# Patient Record
Sex: Male | Born: 1935 | Race: White | Hispanic: No | Marital: Married | Smoking: Former smoker
Health system: Southern US, Community
[De-identification: ages and names within clinical notes are randomized; demographics above are authoritative.]

## PROBLEM LIST (undated history)

## (undated) DIAGNOSIS — J189 Pneumonia, unspecified organism: Secondary | ICD-10-CM

## (undated) DIAGNOSIS — K219 Gastro-esophageal reflux disease without esophagitis: Secondary | ICD-10-CM

## (undated) DIAGNOSIS — R0602 Shortness of breath: Secondary | ICD-10-CM

## (undated) DIAGNOSIS — R918 Other nonspecific abnormal finding of lung field: Secondary | ICD-10-CM

## (undated) DIAGNOSIS — C801 Malignant (primary) neoplasm, unspecified: Secondary | ICD-10-CM

## (undated) DIAGNOSIS — Z8719 Personal history of other diseases of the digestive system: Secondary | ICD-10-CM

## (undated) DIAGNOSIS — I10 Essential (primary) hypertension: Secondary | ICD-10-CM

## (undated) DIAGNOSIS — Z9889 Other specified postprocedural states: Secondary | ICD-10-CM

## (undated) DIAGNOSIS — K769 Liver disease, unspecified: Secondary | ICD-10-CM

## (undated) DIAGNOSIS — H9190 Unspecified hearing loss, unspecified ear: Secondary | ICD-10-CM

## (undated) DIAGNOSIS — T4145XA Adverse effect of unspecified anesthetic, initial encounter: Secondary | ICD-10-CM

## (undated) DIAGNOSIS — Z87442 Personal history of urinary calculi: Secondary | ICD-10-CM

## (undated) DIAGNOSIS — T8859XA Other complications of anesthesia, initial encounter: Secondary | ICD-10-CM

## (undated) DIAGNOSIS — M1990 Unspecified osteoarthritis, unspecified site: Secondary | ICD-10-CM

## (undated) DIAGNOSIS — R590 Localized enlarged lymph nodes: Secondary | ICD-10-CM

## (undated) HISTORY — PX: EYE SURGERY: SHX253

## (undated) HISTORY — DX: Localized enlarged lymph nodes: R59.0

## (undated) HISTORY — DX: Other nonspecific abnormal finding of lung field: R91.8

## (undated) HISTORY — PX: ERCP: SHX60

## (undated) HISTORY — PX: CHOLECYSTECTOMY: SHX55

## (undated) HISTORY — DX: Liver disease, unspecified: K76.9

---

## 1992-01-12 HISTORY — PX: OTHER SURGICAL HISTORY: SHX169

## 1999-01-12 DIAGNOSIS — Z8719 Personal history of other diseases of the digestive system: Secondary | ICD-10-CM

## 1999-01-12 HISTORY — DX: Personal history of other diseases of the digestive system: Z87.19

## 2010-10-19 ENCOUNTER — Encounter (INDEPENDENT_AMBULATORY_CARE_PROVIDER_SITE_OTHER): Payer: Self-pay | Admitting: Ophthalmology

## 2010-10-27 ENCOUNTER — Encounter (INDEPENDENT_AMBULATORY_CARE_PROVIDER_SITE_OTHER): Payer: Medicare Other | Admitting: Ophthalmology

## 2010-10-27 DIAGNOSIS — H3530 Unspecified macular degeneration: Secondary | ICD-10-CM

## 2010-10-27 DIAGNOSIS — H43819 Vitreous degeneration, unspecified eye: Secondary | ICD-10-CM

## 2010-10-27 DIAGNOSIS — H26499 Other secondary cataract, unspecified eye: Secondary | ICD-10-CM

## 2010-11-03 ENCOUNTER — Encounter (INDEPENDENT_AMBULATORY_CARE_PROVIDER_SITE_OTHER): Payer: Medicare Other | Admitting: Ophthalmology

## 2010-11-03 DIAGNOSIS — H2700 Aphakia, unspecified eye: Secondary | ICD-10-CM

## 2011-03-05 ENCOUNTER — Encounter (INDEPENDENT_AMBULATORY_CARE_PROVIDER_SITE_OTHER): Payer: Self-pay | Admitting: *Deleted

## 2011-03-10 ENCOUNTER — Encounter (INDEPENDENT_AMBULATORY_CARE_PROVIDER_SITE_OTHER): Payer: Self-pay | Admitting: *Deleted

## 2011-03-10 ENCOUNTER — Other Ambulatory Visit (INDEPENDENT_AMBULATORY_CARE_PROVIDER_SITE_OTHER): Payer: Self-pay | Admitting: *Deleted

## 2011-03-10 ENCOUNTER — Telehealth (INDEPENDENT_AMBULATORY_CARE_PROVIDER_SITE_OTHER): Payer: Self-pay | Admitting: *Deleted

## 2011-03-10 DIAGNOSIS — Z1211 Encounter for screening for malignant neoplasm of colon: Secondary | ICD-10-CM

## 2011-03-10 NOTE — Telephone Encounter (Signed)
Requesting MD/PCP:  Burdine     Name & DOB: Tyler Pitts December 25, 2024     Procedure: TCS  Reason/Indication:  screening  Has patient had this procedure before?  no  If so, when, by whom and where?    Is there a family history of colon cancer?  no  Who?  What age when diagnosed?    Is patient diabetic?   no      Does patient have prosthetic heart valve?  no  Do you have a pacemaker?  no  Has patient had joint replacement within last 12 months?  no  Is patient on Coumadin, Plavix and/or Aspirin? no  Medications: triamterene/hctz 37.5/25 mg daily, flomax 0.4 mg daily  Allergies: finsin ?  Pharmacy: [facility] sample  Medication Adjustment:   Procedure date & time: 04/02/11 at 1030

## 2011-03-10 NOTE — Telephone Encounter (Signed)
agree

## 2011-03-29 ENCOUNTER — Encounter (HOSPITAL_COMMUNITY): Payer: Self-pay | Admitting: Pharmacy Technician

## 2011-04-01 MED ORDER — SODIUM CHLORIDE 0.45 % IV SOLN
Freq: Once | INTRAVENOUS | Status: AC
  Administered 2011-04-02: 10:00:00 via INTRAVENOUS

## 2011-04-02 ENCOUNTER — Encounter (HOSPITAL_COMMUNITY)
Admission: RE | Disposition: A | Discharge status: Home / Self Care | Payer: Self-pay | Source: Ambulatory Visit | Attending: Internal Medicine

## 2011-04-02 ENCOUNTER — Ambulatory Visit (HOSPITAL_COMMUNITY)
Admission: RE | Admit: 2011-04-02 | Discharge: 2011-04-02 | Disposition: A | Discharge status: Home / Self Care | Payer: Medicare Other | Source: Ambulatory Visit | Attending: Internal Medicine | Admitting: Internal Medicine

## 2011-04-02 ENCOUNTER — Encounter (HOSPITAL_COMMUNITY): Payer: Self-pay | Admitting: *Deleted

## 2011-04-02 DIAGNOSIS — Z1211 Encounter for screening for malignant neoplasm of colon: Secondary | ICD-10-CM

## 2011-04-02 DIAGNOSIS — D126 Benign neoplasm of colon, unspecified: Secondary | ICD-10-CM | POA: Insufficient documentation

## 2011-04-02 DIAGNOSIS — Z79899 Other long term (current) drug therapy: Secondary | ICD-10-CM | POA: Insufficient documentation

## 2011-04-02 DIAGNOSIS — I10 Essential (primary) hypertension: Secondary | ICD-10-CM | POA: Insufficient documentation

## 2011-04-02 HISTORY — PX: COLONOSCOPY: SHX5424

## 2011-04-02 HISTORY — DX: Essential (primary) hypertension: I10

## 2011-04-02 HISTORY — DX: Other specified postprocedural states: Z98.890

## 2011-04-02 HISTORY — DX: Shortness of breath: R06.02

## 2011-04-02 HISTORY — DX: Personal history of other diseases of the digestive system: Z87.19

## 2011-04-02 SURGERY — COLONOSCOPY
Anesthesia: Moderate Sedation

## 2011-04-02 MED ORDER — MEPERIDINE HCL 50 MG/ML IJ SOLN
INTRAMUSCULAR | Status: DC | PRN
  Administered 2011-04-02: 25 mg

## 2011-04-02 MED ORDER — MIDAZOLAM HCL 5 MG/5ML IJ SOLN
INTRAMUSCULAR | Status: AC
  Filled 2011-04-02: qty 10

## 2011-04-02 MED ORDER — STERILE WATER FOR IRRIGATION IR SOLN
Status: DC | PRN
  Administered 2011-04-02: 11:00:00

## 2011-04-02 MED ORDER — MEPERIDINE HCL 50 MG/ML IJ SOLN
INTRAMUSCULAR | Status: AC
  Filled 2011-04-02: qty 1

## 2011-04-02 MED ORDER — MIDAZOLAM HCL 5 MG/5ML IJ SOLN
INTRAMUSCULAR | Status: DC | PRN
  Administered 2011-04-02: 2 mg via INTRAVENOUS

## 2011-04-02 NOTE — Op Note (Addendum)
COLONOSCOPY PROCEDURE REPORT  PATIENT:  Tyler Pitts  MR#:  [id number] Birthdate:  [birth year], 78 y.o., male Endoscopist:  Dr. Malissa Hippo, MD Referred By:  Dr. Juliette Alcide, MD Procedure Date: 04/02/2011  Procedure:   Colonoscopy with snare polypectomy.  Indications: Patient is a 57-year-old Caucasian male who is undergoing average risk screening colonoscopy. This is patient's first exam.  Informed Consent:  The procedure and risks were reviewed with the patient and informed consent was obtained.  Medications:  Demerol 25 mg IV Versed 2 mg IV  Description of procedure:  After a digital rectal exam was performed, that colonoscope was advanced from the anus through the rectum and colon to the area of the cecum, ileocecal valve and appendiceal orifice. The cecum was deeply intubated. These structures were well-seen and photographed for the record. From the level of the cecum and ileocecal valve, the scope was slowly and cautiously withdrawn. The mucosal surfaces were carefully surveyed utilizing scope tip to flexion to facilitate fold flattening as needed. The scope was pulled down into the rectum where a thorough exam including retroflexion was performed.  Findings:   Prep fair. 10 mm sessile polyp snared from ascending colon following saline injection. Some oozing at polypectomy site controlled with single Hemoclip application. 2 small polyps snared from hepatic flexure and submitted together. 8 mm broad-based polyp snared splenic flexure. 3 small polyp snare from sigmoid colon and submitted together. Normal rectal mucosa and anal rectal junction.   Therapeutic/Diagnostic Maneuvers Performed:  See above  Complications:  None  Cecal Withdrawal Time:  37 minutes  Impression:  Examination performed to cecum. 10 mm polyp hot snared from ascending colon following saline injection. Hemoclip applied to polypectomy site because of oozing. Two small polyp snared from hepatic  flexure and submitted together. 8 mm polyp snared from splenic flexure. Three small polyp snare from sigmoid colon and submitted together  Recommendations:  Standard instructions given. No aspirin for 10 days. Patient also instructed not to have MRI until documented to have passed the Hemoclip. I will contact patient with biopsy results.  Shikara Mcauliffe U  04/02/2011 11:33 AM  CC: Dr. Juliette Alcide, MD, MD & Dr. Bonnetta Barry ref. provider found

## 2011-04-02 NOTE — Discharge Instructions (Signed)
No aspirin for 10 days. His usual medications and diet. No driving for 24 hours. Physician will contact you with biopsy results  Colonoscopy Care After These instructions give you information on caring for yourself after your procedure. Your doctor may also give you more specific instructions. Call your doctor if you have any problems or questions after your procedure. HOME CARE  Take it easy for the next 24 hours.   Rest.   Walk or use warm packs on your belly (abdomen) if you have belly cramping or gas.   Do not drive for 24 hours.   You may shower.   Do not sign important papers or use machinery for 24 hours.   Drink enough fluids to keep your pee (urine) clear or pale yellow.   Resume your normal diet. Avoid heavy or fried foods.   Avoid alcohol.   Continue taking your normal medicines.   Only take medicine as told by your doctor. Do not take aspirin.  If you had growths (polyps) removed:  Do not take aspirin.   Do not drink alcohol for 7 days or as told by your doctor.   Eat a soft diet for 24 hours.  GET HELP RIGHT AWAY IF:  You have a fever.   You pass clumps of tissue (blood clots) or fill the toilet with blood.   You have belly pain that gets worse and medicine does not help.   Your belly is puffy (swollen).   You feel sick to your stomach (nauseous) or throw up (vomit).  MAKE SURE YOU:  Understand these instructions.   Will watch your condition.   Will get help right away if you are not doing well or get worse.  Document Released: 01/30/2010 Document Revised: 12/17/2010 Document Reviewed: 01/30/2010 [facility] Patient Information 2012 [place], [place].  Colon Polyps A polyp is extra tissue that grows inside your body. Colon polyps grow in the large intestine. The large intestine, also called the colon, is part of your digestive system. It is a long, hollow tube at the end of your digestive tract where your body makes and stores stool. Most polyps  are not dangerous. They are benign. This means they are not cancerous. But over time, some types of polyps can turn into cancer. Polyps that are smaller than a pea are usually not harmful. But larger polyps could someday become or may already be cancerous. To be safe, doctors remove all polyps and test them.  WHO GETS POLYPS? Anyone can get polyps, but certain people are more likely than others. You may have a greater chance of getting polyps if:  You are over 50.   You have had polyps before.   Someone in your family has had polyps.   Someone in your family has had cancer of the large intestine.   Find out if someone in your family has had polyps. You may also be more likely to get polyps if you:   Eat a lot of fatty foods.   Smoke.   Drink alcohol.   Do not exercise.   Eat too much.  SYMPTOMS  Most small polyps do not cause symptoms. People often do not know they have one until their caregiver finds it during a regular checkup or while testing them for something else. Some people do have symptoms like these:  Bleeding from the anus. You might notice blood on your underwear or on toilet paper after you have had a bowel movement.   Constipation or diarrhea that lasts  more than a week.   Blood in the stool. Blood can make stool look black or it can show up as red streaks in the stool.  If you have any of these symptoms, see your caregiver. HOW DOES THE DOCTOR TEST FOR POLYPS? The doctor can use four tests to check for polyps:  Digital rectal exam. The caregiver wears gloves and checks your rectum (the last part of the large intestine) to see if it feels normal. This test would find polyps only in the rectum. Your caregiver may need to do one of the other tests listed below to find polyps higher up in the intestine.   Barium enema. The caregiver puts a liquid called barium into your rectum before taking x-rays of your large intestine. Barium makes your intestine look white in the  pictures. Polyps are dark, so they are easy to see.   Sigmoidoscopy. With this test, the caregiver can see inside your large intestine. A thin flexible tube is placed into your rectum. The device is called a sigmoidoscope, which has a light and a tiny video camera in it. The caregiver uses the sigmoidoscope to look at the last third of your large intestine.   Colonoscopy. This test is like sigmoidoscopy, but the caregiver looks at all of the large intestine. It usually requires sedation. This is the most common method for finding and removing polyps.  TREATMENT   The caregiver will remove the polyp during sigmoidoscopy or colonoscopy. The polyp is then tested for cancer.   If you have had polyps, your caregiver may want you to get tested regularly in the future.  PREVENTION  There is not one sure way to prevent polyps. You might be able to lower your risk of getting them if you:  Eat more fruits and vegetables and less fatty food.   Do not smoke.   Avoid alcohol.   Exercise every day.   Lose weight if you are overweight.   Eating more calcium and folate can also lower your risk of getting polyps. Some foods that are rich in calcium are milk, cheese, and broccoli. Some foods that are rich in folate are chickpeas, kidney beans, and spinach.   Aspirin might help prevent polyps. Studies are under way.  Document Released: 09/24/2003 Document Revised: 12/17/2010 Document Reviewed: 03/01/2007 [facility] Patient Information 2012 [place], [place].

## 2011-04-02 NOTE — H&P (Signed)
Tyler Pitts is an 78 y.o. male.   Chief Complaint: Patient is here for colonoscopy. HPI: Patient is an 78 year old Caucasian male who is here for his first average risk screening colonoscopy. He denies abdominal pain change in his bowel habits or rectal bleeding. Since he is doing so well from a standpoint he after discussion with his primary care physician Dr. Lynelle Smoke decided to have this examination.  Past Medical History  Diagnosis Date  . Hypertension   . History of pancreatitis 2001  . History of ERCP   . Shortness of breath     Past Surgical History  Procedure Date  . Left hip surgery 1995  . Cholecystectomy   . Ercp     Family History  Problem Relation Age of Onset  . Lung cancer Mother   . Lung cancer Father   . Colon cancer Neg Hx    Social History:  reports that he has quit smoking. He does not have any smokeless tobacco history on file. He reports that he does not drink alcohol or use illicit drugs.  Allergies: No Known Allergies  Medications Prior to Admission  Medication Dose Route Frequency Provider Last Rate Last Dose  . 0.45 % sodium chloride infusion   Intravenous Once Malissa Hippo, MD 20 mL/hr at 04/02/11 0940    . meperidine (DEMEROL) 50 MG/ML injection           . midazolam (VERSED) 5 MG/5ML injection            Medications Prior to Admission  Medication Sig Dispense Refill  . Tamsulosin HCl (FLOMAX) 0.4 MG CAPS Take 0.4 mg by mouth daily.      Marland Kitchen triamterene-hydrochlorothiazide (MAXZIDE-25) 37.5-25 MG per tablet Take 1 tablet by mouth daily.        No results found for this or any previous visit (from the past 48 hour(s)). No results found.  Review of Systems  Constitutional: Negative for weight loss.  Gastrointestinal: Negative for abdominal pain, diarrhea, constipation, blood in stool and melena.    Blood pressure 152/81, temperature 98.1 F (36.7 C), temperature source Oral, resp. rate 20, height 5\' 6"  (1.676 m), weight 165 lb  (74.844 kg), SpO2 95.00%. Physical Exam  Constitutional: He appears well-developed and well-nourished.  HENT:  Mouth/Throat: Oropharynx is clear and moist.  Eyes: Conjunctivae are normal. No scleral icterus.  Neck: No thyromegaly present.  Cardiovascular: Normal rate, regular rhythm and normal heart sounds.   No murmur heard. Respiratory: Effort normal and breath sounds normal.  GI: Soft. He exhibits no mass. There is no tenderness.  Musculoskeletal: He exhibits no edema.  Lymphadenopathy:    He has no cervical adenopathy.  Neurological: He is alert.  Skin: Skin is warm and dry.     Assessment/Plan Average risk screening colonoscopy.  Nancyann Cotterman U 04/02/2011, 10:38 AM

## 2011-04-06 ENCOUNTER — Encounter (HOSPITAL_COMMUNITY): Payer: Self-pay | Admitting: Internal Medicine

## 2011-04-14 ENCOUNTER — Encounter (INDEPENDENT_AMBULATORY_CARE_PROVIDER_SITE_OTHER): Payer: Self-pay | Admitting: *Deleted

## 2014-03-08 ENCOUNTER — Encounter (INDEPENDENT_AMBULATORY_CARE_PROVIDER_SITE_OTHER): Payer: Self-pay | Admitting: *Deleted

## 2014-03-14 ENCOUNTER — Other Ambulatory Visit (INDEPENDENT_AMBULATORY_CARE_PROVIDER_SITE_OTHER): Payer: Self-pay | Admitting: *Deleted

## 2014-03-14 ENCOUNTER — Telehealth (INDEPENDENT_AMBULATORY_CARE_PROVIDER_SITE_OTHER): Payer: Self-pay | Admitting: *Deleted

## 2014-03-14 DIAGNOSIS — Z86010 Personal history of colonic polyps: Secondary | ICD-10-CM

## 2014-03-14 DIAGNOSIS — Z1211 Encounter for screening for malignant neoplasm of colon: Secondary | ICD-10-CM

## 2014-03-14 NOTE — Telephone Encounter (Signed)
Patient needs trilyte 

## 2014-03-15 MED ORDER — PEG 3350-KCL-NA BICARB-NACL 420 G PO SOLR: 4000.0000 mL | Freq: Once | ORAL | Status: DC

## 2014-04-08 ENCOUNTER — Telehealth (INDEPENDENT_AMBULATORY_CARE_PROVIDER_SITE_OTHER): Payer: Self-pay | Admitting: *Deleted

## 2014-04-08 NOTE — Telephone Encounter (Signed)
Referring MD/PCP: burdine   Procedure: tcs  Reason/Indication:  Hx polyps  Has patient had this procedure before?  Yes, 2013 -- epic  If so, when, by whom and where?    Is there a family history of colon cancer?  no  Who?  What age when diagnosed?    Is patient diabetic?   no      Does patient have prosthetic heart valve?  no  Do you have a pacemaker?  no  Has patient ever had endocarditis? no  Has patient had joint replacement within last 12 months?  no  Does patient tend to be constipated or take laxatives? ocassionally  Is patient on Coumadin, Plavix and/or Aspirin? no  Medications: see epic  Allergies: see epic  Medication Adjustment:   Procedure date & time: 05/09/14 at 1030

## 2014-04-08 NOTE — Telephone Encounter (Signed)
agree

## 2014-05-09 ENCOUNTER — Ambulatory Visit (HOSPITAL_COMMUNITY)
Admission: RE | Admit: 2014-05-09 | Discharge: 2014-05-09 | Disposition: A | Discharge status: Home / Self Care | Payer: Medicare Other | Source: Ambulatory Visit | Attending: Internal Medicine | Admitting: Internal Medicine

## 2014-05-09 ENCOUNTER — Encounter (HOSPITAL_COMMUNITY): Payer: Self-pay

## 2014-05-09 ENCOUNTER — Encounter (HOSPITAL_COMMUNITY)
Admission: RE | Disposition: A | Discharge status: Home / Self Care | Payer: Self-pay | Source: Ambulatory Visit | Attending: Internal Medicine

## 2014-05-09 DIAGNOSIS — Z9049 Acquired absence of other specified parts of digestive tract: Secondary | ICD-10-CM | POA: Diagnosis not present

## 2014-05-09 DIAGNOSIS — Z79899 Other long term (current) drug therapy: Secondary | ICD-10-CM | POA: Insufficient documentation

## 2014-05-09 DIAGNOSIS — K648 Other hemorrhoids: Secondary | ICD-10-CM | POA: Diagnosis not present

## 2014-05-09 DIAGNOSIS — Z87891 Personal history of nicotine dependence: Secondary | ICD-10-CM | POA: Insufficient documentation

## 2014-05-09 DIAGNOSIS — Z86010 Personal history of colonic polyps: Secondary | ICD-10-CM

## 2014-05-09 DIAGNOSIS — D123 Benign neoplasm of transverse colon: Secondary | ICD-10-CM | POA: Diagnosis not present

## 2014-05-09 DIAGNOSIS — Z09 Encounter for follow-up examination after completed treatment for conditions other than malignant neoplasm: Secondary | ICD-10-CM | POA: Diagnosis present

## 2014-05-09 DIAGNOSIS — D125 Benign neoplasm of sigmoid colon: Secondary | ICD-10-CM | POA: Diagnosis not present

## 2014-05-09 DIAGNOSIS — K5730 Diverticulosis of large intestine without perforation or abscess without bleeding: Secondary | ICD-10-CM

## 2014-05-09 DIAGNOSIS — K644 Residual hemorrhoidal skin tags: Secondary | ICD-10-CM | POA: Insufficient documentation

## 2014-05-09 DIAGNOSIS — I10 Essential (primary) hypertension: Secondary | ICD-10-CM | POA: Insufficient documentation

## 2014-05-09 DIAGNOSIS — D120 Benign neoplasm of cecum: Secondary | ICD-10-CM | POA: Insufficient documentation

## 2014-05-09 HISTORY — PX: COLONOSCOPY: SHX5424

## 2014-05-09 SURGERY — COLONOSCOPY
Anesthesia: Moderate Sedation

## 2014-05-09 MED ORDER — MEPERIDINE HCL 50 MG/ML IJ SOLN
INTRAMUSCULAR | Status: AC
  Filled 2014-05-09: qty 1

## 2014-05-09 MED ORDER — MIDAZOLAM HCL 5 MG/5ML IJ SOLN
INTRAMUSCULAR | Status: AC
  Filled 2014-05-09: qty 10

## 2014-05-09 MED ORDER — SODIUM CHLORIDE 0.9 % IV SOLN
INTRAVENOUS | Status: DC
  Administered 2014-05-09: 11:00:00 via INTRAVENOUS

## 2014-05-09 MED ORDER — STERILE WATER FOR IRRIGATION IR SOLN
Status: DC | PRN
  Administered 2014-05-09: 11:00:00

## 2014-05-09 MED ORDER — MIDAZOLAM HCL 5 MG/5ML IJ SOLN
INTRAMUSCULAR | Status: DC | PRN
  Administered 2014-05-09: 2 mg via INTRAVENOUS
  Administered 2014-05-09: 1 mg via INTRAVENOUS

## 2014-05-09 MED ORDER — MEPERIDINE HCL 50 MG/ML IJ SOLN
INTRAMUSCULAR | Status: DC | PRN
  Administered 2014-05-09: 25 mg via INTRAVENOUS

## 2014-05-09 NOTE — Op Note (Signed)
COLONOSCOPY PROCEDURE REPORT  PATIENT:  ANKIT DEGREGORIO  MR#:  [id number] Birthdate:  1924-12-06, 81 y.o., male Endoscopist:  Dr. Rogene Houston, MD Referred By:  Dr. Curlene Labrum, MD Procedure Date: 05/09/2014  Procedure:   Colonoscopy with snare polypectomy  Indications:  Patient is 81 year old Caucasian male who had his first screening colonoscopy in March 2013 with removal of 7 polyps in 4 were tubular adenomas. He has no GI symptoms. Is returning for surveillance colonoscopy.  Informed Consent:  The procedure and risks were reviewed with the patient and informed consent was obtained.  Medications:  Demerol 25 mg IV Versed 3 mg IV  Description of procedure:  After a digital rectal exam was performed, that colonoscope was advanced from the anus through the rectum and colon to the area of the cecum, ileocecal valve and appendiceal orifice. The cecum was deeply intubated. These structures were well-seen and photographed for the record. From the level of the cecum and ileocecal valve, the scope was slowly and cautiously withdrawn. The mucosal surfaces were carefully surveyed utilizing scope tip to flexion to facilitate fold flattening as needed. The scope was pulled down into the rectum where a thorough exam including retroflexion was performed.  Findings:   Prep excellent. 4 small polyps were cold snared and submitted together. One may have been lost. Two were at cecum and 2 at sigmoid colon. 6 mm polyp hot snared from hepatic flexure and submitted separately. Single small diverticulum at splenic flexure. Hemorrhoids above and below the dentate line.   Therapeutic/Diagnostic Maneuvers Performed:  See above  Complications:  None  Cecal Withdrawal Time:  17 minutes  Impression:  Examination performed to cecum. Four small polyps were cold snared and submitted together;  two of these polyps were at cecum and two at sigmoid colon. 6 mm polyp hot snare from hepatic  flexure. Single small diverticulum at splenic flexure. Small internal and external hemorrhoids.  Recommendations:  Standard instructions given. No aspirin or NSAIDs for 1 week. I will contact patient with biopsy results and further recommendations.  REHMAN,NAJEEB U  05/09/2014 12:08 PM  CC: Dr. Curlene Labrum, MD & Dr. Rayne Du ref. provider found

## 2014-05-09 NOTE — Discharge Instructions (Signed)
No aspirin or NSAIDs for 1 week. Resume usual medications and diet. No driving for 24 hours. Physician will call with biopsy results.     Colonoscopy, Care After These instructions give you information on caring for yourself after your procedure. Your doctor may also give you more specific instructions. Call your doctor if you have any problems or questions after your procedure. HOME CARE  Do not drive for 24 hours.  Do not sign important papers or use machinery for 24 hours.  You may shower.  You may go back to your usual activities, but go slower for the first 24 hours.  Take rest breaks often during the first 24 hours.  Walk around or use warm packs on your belly (abdomen) if you have belly cramping or gas.  Drink enough fluids to keep your pee (urine) clear or pale yellow.  Resume your normal diet. Avoid heavy or fried foods.  Avoid drinking alcohol for 24 hours or as told by your doctor.  Only take medicines as told by your doctor. If a tissue sample (biopsy) was taken during the procedure:   Do not take aspirin or blood thinners for 7 days, or as told by your doctor.  Do not drink alcohol for 7 days, or as told by your doctor.  Eat soft foods for the first 24 hours. GET HELP IF: You still have a small amount of blood in your poop (stool) 2-3 days after the procedure. GET HELP RIGHT AWAY IF:  You have more than a small amount of blood in your poop.  You see clumps of tissue (blood clots) in your poop.  Your belly is puffy (swollen).  You feel sick to your stomach (nauseous) or throw up (vomit).  You have a fever.  You have belly pain that gets worse and medicine does not help. MAKE SURE YOU:  Understand these instructions.  Will watch your condition.  Will get help right away if you are not doing well or get worse.   Colon Polyps Polyps are lumps of extra tissue growing inside the body. Polyps can grow in the large intestine (colon). Most colon  polyps are noncancerous (benign). However, some colon polyps can become cancerous over time. Polyps that are larger than a pea may be harmful. To be safe, caregivers remove and test all polyps. CAUSES  Polyps form when mutations in the genes cause your cells to grow and divide even though no more tissue is needed. RISK FACTORS There are a number of risk factors that can increase your chances of getting colon polyps. They include:  Being older than 50 years.  Family history of colon polyps or colon cancer.  Long-term colon diseases, such as colitis or Crohn disease.  Being overweight.  Smoking.  Being inactive.  Drinking too much alcohol. SYMPTOMS  Most small polyps do not cause symptoms. If symptoms are present, they may include:  Blood in the stool. The stool may look dark red or black.  Constipation or diarrhea that lasts longer than 1 week. DIAGNOSIS People often do not know they have polyps until their caregiver finds them during a regular checkup. Your caregiver can use 4 tests to check for polyps:  Digital rectal exam. The caregiver wears gloves and feels inside the rectum. This test would find polyps only in the rectum.  Barium enema. The caregiver puts a liquid called barium into your rectum before taking X-rays of your colon. Barium makes your colon look white. Polyps are dark, so they are  easy to see in the X-ray pictures.  Sigmoidoscopy. A thin, flexible tube (sigmoidoscope) is placed into your rectum. The sigmoidoscope has a light and tiny camera in it. The caregiver uses the sigmoidoscope to look at the last third of your colon.  Colonoscopy. This test is like sigmoidoscopy, but the caregiver looks at the entire colon. This is the most common method for finding and removing polyps. TREATMENT  Any polyps will be removed during a sigmoidoscopy or colonoscopy. The polyps are then tested for cancer. PREVENTION  To help lower your risk of getting more colon  polyps:  Eat plenty of fruits and vegetables. Avoid eating fatty foods.  Do not smoke.  Avoid drinking alcohol.  Exercise every day.  Lose weight if recommended by your caregiver.  Eat plenty of calcium and folate. Foods that are rich in calcium include milk, cheese, and broccoli. Foods that are rich in folate include chickpeas, kidney beans, and spinach. HOME CARE INSTRUCTIONS Keep all follow-up appointments as directed by your caregiver. You may need periodic exams to check for polyps. SEEK MEDICAL CARE IF: You notice bleeding during a bowel movement. Document Released: 09/24/2003 Document Revised: 03/22/2011 Document Reviewed: 03/09/2011 [facility] Patient Information 2015 [place], [place]. This information is not intended to replace advice given to you by your health care provider. Make sure you discuss any questions you have with your health care provider.

## 2014-05-09 NOTE — H&P (Signed)
Tyler Pitts is an 81 y.o. male.   Chief Complaint: Patient is here for colonoscopy. HPI: Patient is 81 year old Caucasian male who underwent revision screening colonoscopy in March 2015 and was found to have multiple polyps in 4 of these are tubular adenomas. He is therefore returning for surveillance colonoscopy. He denies abdominal pain change in bowel habits or rectal bleeding. He exercises regularly. He's cut back on starchy foods and has lost 15 pounds. Family history is negative for CRC. Both his parents died of lung carcinoma in her 71s and they both smoked cigarettes.  Past Medical History  Diagnosis Date  . Hypertension   . History of pancreatitis 2001  . History of ERCP   . Shortness of breath     Past Surgical History  Procedure Laterality Date  . Left hip surgery  1995  . Cholecystectomy    . Ercp    . Colonoscopy  04/02/2011    Procedure: COLONOSCOPY;  Surgeon: Tyler Houston, MD;  Location: [facility];  Service: Endoscopy;  Laterality: N/A;  1030    Family History  Problem Relation Age of Onset  . Lung cancer Mother   . Lung cancer Father   . Colon cancer Neg Hx    Social History:  reports that he has quit smoking. He does not have any smokeless tobacco history on file. He reports that he does not drink alcohol or use illicit drugs.  Allergies: No Known Allergies  Medications Prior to Admission  Medication Sig Dispense Refill  . polyethylene glycol-electrolytes (NULYTELY/GOLYTELY) 420 G solution Take 4,000 mLs by mouth once. 4000 mL 0  . Tamsulosin HCl (FLOMAX) 0.4 MG CAPS Take 0.4 mg by mouth daily.    Marland Kitchen triamterene-hydrochlorothiazide (MAXZIDE-25) 37.5-25 MG per tablet Take 1 tablet by mouth daily.      No results found for this or any previous visit (from the past 48 hour(s)). No results found.  ROS  Blood pressure 134/79, pulse 68, temperature 97.1 F (36.2 C), temperature source Oral, resp. rate 22, height '5\' 6"'$  (1.676 m), weight 150 lb (68.04  kg), SpO2 96 %. Physical Exam  Constitutional: He appears well-developed and well-nourished.  HENT:  Mouth/Throat: Oropharynx is clear and moist.  Eyes: Conjunctivae are normal. No scleral icterus.  Neck: No thyromegaly present.  Cardiovascular: Normal rate, regular rhythm and normal heart sounds.   No murmur heard. Respiratory: Effort normal and breath sounds normal.  GI: Soft. He exhibits no distension and no mass. There is no tenderness.  Musculoskeletal: He exhibits no edema.  Lymphadenopathy:    He has no cervical adenopathy.  Neurological: He is alert.  Skin: Skin is warm and dry.     Assessment/Plan History of colonic adenomas. Surveillance colonoscopy.  [name] U 05/09/2014, 11:20 AM

## 2014-05-10 ENCOUNTER — Encounter (HOSPITAL_COMMUNITY): Payer: Self-pay | Admitting: Internal Medicine

## 2014-05-15 ENCOUNTER — Encounter (INDEPENDENT_AMBULATORY_CARE_PROVIDER_SITE_OTHER): Payer: Self-pay | Admitting: *Deleted

## 2015-02-25 DIAGNOSIS — I10 Essential (primary) hypertension: Secondary | ICD-10-CM | POA: Diagnosis not present

## 2015-02-25 DIAGNOSIS — E782 Mixed hyperlipidemia: Secondary | ICD-10-CM | POA: Diagnosis not present

## 2015-03-07 DIAGNOSIS — I10 Essential (primary) hypertension: Secondary | ICD-10-CM | POA: Diagnosis not present

## 2015-03-07 DIAGNOSIS — E559 Vitamin D deficiency, unspecified: Secondary | ICD-10-CM | POA: Diagnosis not present

## 2015-03-07 DIAGNOSIS — Z86010 Personal history of colonic polyps: Secondary | ICD-10-CM | POA: Diagnosis not present

## 2015-03-07 DIAGNOSIS — E782 Mixed hyperlipidemia: Secondary | ICD-10-CM | POA: Diagnosis not present

## 2015-03-07 DIAGNOSIS — L719 Rosacea, unspecified: Secondary | ICD-10-CM | POA: Diagnosis not present

## 2015-09-16 DIAGNOSIS — M1990 Unspecified osteoarthritis, unspecified site: Secondary | ICD-10-CM | POA: Diagnosis not present

## 2015-09-16 DIAGNOSIS — E559 Vitamin D deficiency, unspecified: Secondary | ICD-10-CM | POA: Diagnosis not present

## 2015-09-16 DIAGNOSIS — R39198 Other difficulties with micturition: Secondary | ICD-10-CM | POA: Diagnosis not present

## 2015-09-16 DIAGNOSIS — I10 Essential (primary) hypertension: Secondary | ICD-10-CM | POA: Diagnosis not present

## 2015-09-16 DIAGNOSIS — E782 Mixed hyperlipidemia: Secondary | ICD-10-CM | POA: Diagnosis not present

## 2015-09-18 DIAGNOSIS — E782 Mixed hyperlipidemia: Secondary | ICD-10-CM | POA: Diagnosis not present

## 2015-09-18 DIAGNOSIS — M7552 Bursitis of left shoulder: Secondary | ICD-10-CM | POA: Diagnosis not present

## 2015-09-18 DIAGNOSIS — M19012 Primary osteoarthritis, left shoulder: Secondary | ICD-10-CM | POA: Diagnosis not present

## 2015-09-18 DIAGNOSIS — I10 Essential (primary) hypertension: Secondary | ICD-10-CM | POA: Diagnosis not present

## 2015-09-18 DIAGNOSIS — E559 Vitamin D deficiency, unspecified: Secondary | ICD-10-CM | POA: Diagnosis not present

## 2015-09-18 DIAGNOSIS — L719 Rosacea, unspecified: Secondary | ICD-10-CM | POA: Diagnosis not present

## 2015-11-17 DIAGNOSIS — I10 Essential (primary) hypertension: Secondary | ICD-10-CM | POA: Diagnosis not present

## 2015-11-17 DIAGNOSIS — H524 Presbyopia: Secondary | ICD-10-CM | POA: Diagnosis not present

## 2015-11-17 DIAGNOSIS — Z961 Presence of intraocular lens: Secondary | ICD-10-CM | POA: Diagnosis not present

## 2015-11-17 DIAGNOSIS — H35033 Hypertensive retinopathy, bilateral: Secondary | ICD-10-CM | POA: Diagnosis not present

## 2015-11-17 DIAGNOSIS — H353131 Nonexudative age-related macular degeneration, bilateral, early dry stage: Secondary | ICD-10-CM | POA: Diagnosis not present

## 2015-11-17 DIAGNOSIS — H5203 Hypermetropia, bilateral: Secondary | ICD-10-CM | POA: Diagnosis not present

## 2015-11-17 DIAGNOSIS — H43813 Vitreous degeneration, bilateral: Secondary | ICD-10-CM | POA: Diagnosis not present

## 2015-11-17 DIAGNOSIS — H5310 Unspecified subjective visual disturbances: Secondary | ICD-10-CM | POA: Diagnosis not present

## 2015-11-17 DIAGNOSIS — Z9849 Cataract extraction status, unspecified eye: Secondary | ICD-10-CM | POA: Diagnosis not present

## 2015-11-17 DIAGNOSIS — H5315 Visual distortions of shape and size: Secondary | ICD-10-CM | POA: Diagnosis not present

## 2015-12-22 DIAGNOSIS — R69 Illness, unspecified: Secondary | ICD-10-CM | POA: Diagnosis not present

## 2015-12-29 DIAGNOSIS — H43813 Vitreous degeneration, bilateral: Secondary | ICD-10-CM | POA: Diagnosis not present

## 2015-12-29 DIAGNOSIS — H353131 Nonexudative age-related macular degeneration, bilateral, early dry stage: Secondary | ICD-10-CM | POA: Diagnosis not present

## 2015-12-29 DIAGNOSIS — H35033 Hypertensive retinopathy, bilateral: Secondary | ICD-10-CM | POA: Diagnosis not present

## 2015-12-29 DIAGNOSIS — Z961 Presence of intraocular lens: Secondary | ICD-10-CM | POA: Diagnosis not present

## 2015-12-29 DIAGNOSIS — H5315 Visual distortions of shape and size: Secondary | ICD-10-CM | POA: Diagnosis not present

## 2015-12-29 DIAGNOSIS — Z9849 Cataract extraction status, unspecified eye: Secondary | ICD-10-CM | POA: Diagnosis not present

## 2016-02-02 DIAGNOSIS — Z23 Encounter for immunization: Secondary | ICD-10-CM | POA: Diagnosis not present

## 2016-03-06 DIAGNOSIS — J189 Pneumonia, unspecified organism: Secondary | ICD-10-CM | POA: Diagnosis not present

## 2016-03-06 DIAGNOSIS — Z79899 Other long term (current) drug therapy: Secondary | ICD-10-CM | POA: Diagnosis not present

## 2016-03-06 DIAGNOSIS — Z87891 Personal history of nicotine dependence: Secondary | ICD-10-CM | POA: Diagnosis not present

## 2016-03-06 DIAGNOSIS — I10 Essential (primary) hypertension: Secondary | ICD-10-CM | POA: Diagnosis not present

## 2016-03-06 DIAGNOSIS — M79602 Pain in left arm: Secondary | ICD-10-CM | POA: Diagnosis not present

## 2016-03-06 DIAGNOSIS — R0789 Other chest pain: Secondary | ICD-10-CM | POA: Diagnosis not present

## 2016-03-06 DIAGNOSIS — Z888 Allergy status to other drugs, medicaments and biological substances status: Secondary | ICD-10-CM | POA: Diagnosis not present

## 2016-03-06 DIAGNOSIS — R918 Other nonspecific abnormal finding of lung field: Secondary | ICD-10-CM | POA: Diagnosis not present

## 2016-03-06 DIAGNOSIS — R079 Chest pain, unspecified: Secondary | ICD-10-CM | POA: Diagnosis not present

## 2016-03-06 DIAGNOSIS — R1013 Epigastric pain: Secondary | ICD-10-CM | POA: Diagnosis not present

## 2016-03-06 DIAGNOSIS — N400 Enlarged prostate without lower urinary tract symptoms: Secondary | ICD-10-CM | POA: Diagnosis not present

## 2016-03-06 DIAGNOSIS — Z96642 Presence of left artificial hip joint: Secondary | ICD-10-CM | POA: Diagnosis not present

## 2016-03-07 DIAGNOSIS — R079 Chest pain, unspecified: Secondary | ICD-10-CM | POA: Diagnosis not present

## 2016-03-09 ENCOUNTER — Other Ambulatory Visit (HOSPITAL_COMMUNITY): Payer: Self-pay | Admitting: Family Medicine

## 2016-03-09 DIAGNOSIS — R918 Other nonspecific abnormal finding of lung field: Secondary | ICD-10-CM

## 2016-03-17 ENCOUNTER — Ambulatory Visit (HOSPITAL_COMMUNITY)
Admission: RE | Admit: 2016-03-17 | Discharge: 2016-03-17 | Disposition: A | Discharge status: Home / Self Care | Payer: Medicare HMO | Source: Ambulatory Visit | Attending: Family Medicine | Admitting: Family Medicine

## 2016-03-17 DIAGNOSIS — L719 Rosacea, unspecified: Secondary | ICD-10-CM | POA: Diagnosis not present

## 2016-03-17 DIAGNOSIS — E559 Vitamin D deficiency, unspecified: Secondary | ICD-10-CM | POA: Diagnosis not present

## 2016-03-17 DIAGNOSIS — J90 Pleural effusion, not elsewhere classified: Secondary | ICD-10-CM | POA: Diagnosis not present

## 2016-03-17 DIAGNOSIS — C3412 Malignant neoplasm of upper lobe, left bronchus or lung: Secondary | ICD-10-CM | POA: Diagnosis not present

## 2016-03-17 DIAGNOSIS — I700 Atherosclerosis of aorta: Secondary | ICD-10-CM | POA: Diagnosis not present

## 2016-03-17 DIAGNOSIS — R918 Other nonspecific abnormal finding of lung field: Secondary | ICD-10-CM | POA: Insufficient documentation

## 2016-03-17 DIAGNOSIS — R590 Localized enlarged lymph nodes: Secondary | ICD-10-CM | POA: Insufficient documentation

## 2016-03-17 DIAGNOSIS — E782 Mixed hyperlipidemia: Secondary | ICD-10-CM | POA: Diagnosis not present

## 2016-03-17 DIAGNOSIS — K769 Liver disease, unspecified: Secondary | ICD-10-CM

## 2016-03-17 DIAGNOSIS — J189 Pneumonia, unspecified organism: Secondary | ICD-10-CM | POA: Diagnosis not present

## 2016-03-17 DIAGNOSIS — J9811 Atelectasis: Secondary | ICD-10-CM | POA: Insufficient documentation

## 2016-03-17 DIAGNOSIS — M7552 Bursitis of left shoulder: Secondary | ICD-10-CM | POA: Diagnosis not present

## 2016-03-17 DIAGNOSIS — J984 Other disorders of lung: Secondary | ICD-10-CM | POA: Insufficient documentation

## 2016-03-17 DIAGNOSIS — I10 Essential (primary) hypertension: Secondary | ICD-10-CM | POA: Diagnosis not present

## 2016-03-17 HISTORY — DX: Liver disease, unspecified: K76.9

## 2016-03-17 HISTORY — DX: Localized enlarged lymph nodes: R59.0

## 2016-03-17 HISTORY — DX: Other nonspecific abnormal finding of lung field: R91.8

## 2016-03-17 MED ORDER — IOPAMIDOL (ISOVUE-300) INJECTION 61%
75.0000 mL | Freq: Once | INTRAVENOUS | Status: AC | PRN
  Administered 2016-03-17: 75 mL via INTRAVENOUS

## 2016-03-24 ENCOUNTER — Encounter: Payer: Self-pay | Admitting: *Deleted

## 2016-03-25 ENCOUNTER — Institutional Professional Consult (permissible substitution) (INDEPENDENT_AMBULATORY_CARE_PROVIDER_SITE_OTHER): Payer: Medicare HMO | Admitting: Cardiothoracic Surgery

## 2016-03-25 ENCOUNTER — Encounter: Payer: Medicare HMO | Admitting: Cardiothoracic Surgery

## 2016-03-25 VITALS — BP 145/80 | HR 79 | Resp 20 | Ht 66.0 in | Wt 160.0 lb

## 2016-03-25 DIAGNOSIS — R918 Other nonspecific abnormal finding of lung field: Secondary | ICD-10-CM | POA: Diagnosis not present

## 2016-03-25 NOTE — Progress Notes (Signed)
[address]       [place] [address]             [phone]                    Treyshaun J Faniel [place] Medical Record #[id number] Date of Birth: 06/08/1924  Referring: No ref. provider found Primary Care: Curlene Labrum, MD  Chief Complaint:    Chief Complaint  Patient presents with  . Lung Mass    Surgical eval, Chest CT 03/17/16    History of Present Illness:    Tyler Pitts 83 y.o. male is seen in the office  today for Abnormal CT of the chest.Patient notes that in February 2018 he had left chest discomfort. A chest x-ray was done and he was treated for pneumonia follow-up CT scan suggested a left hilar mass and the patient was referred to thoracic surgery.  Patient quit smoking in 1975 the day his father was diagnosed with lung cancer      Current Activity/ Functional Status:  Patient is independent with mobility/ambulation, transfers, ADL's, IADL's.   Zubrod Score: At the time of surgery this patient's most appropriate activity status/level should be described as: '[]'$     0    Normal activity, no symptoms '[x]'$     1    Restricted in physical strenuous activity but ambulatory, able to do out light work '[]'$     2    Ambulatory and capable of self care, unable to do work activities, up and about               >50 % of waking hours                              '[]'$     3    Only limited self care, in bed greater than 50% of waking hours '[]'$     4    Completely disabled, no self care, confined to bed or chair '[]'$     5    Moribund   Past Medical History:  Diagnosis Date  . Hepatic lesions 03/17/2016   PER CT CHEST  . Hilar adenopathy 03/17/2016   PER CT CHEST   . History of ERCP   . History of pancreatitis 2001  . Hypertension   . Mass of upper lobe of left lung 03/17/2016   PER CT CHEST 03/17/16  . Shortness of breath     Past Surgical History:  Procedure Laterality Date  . CHOLECYSTECTOMY    . COLONOSCOPY  04/02/2011   Procedure:  COLONOSCOPY;  Surgeon: Rogene Houston, MD;  Location: [facility];  Service: Endoscopy;  Laterality: N/A;  1030  . COLONOSCOPY N/A 05/09/2014   Procedure: COLONOSCOPY;  Surgeon: Rogene Houston, MD;  Location: [facility];  Service: Endoscopy;  Laterality: N/A;  1030  . ERCP    . Left hip surgery Left 1994   DUE TO FALL.Marland KitchenHAD BACK FX AT THE SAME TIME    Family History  Problem Relation Age of Onset  . Lung cancer Mother   . Lung cancer Father   . Colon cancer Neg Hx     Social History   Social History  . Marital status: Married    Spouse name: N/A  . Number of children: N/A  . Years of education: N/A   Occupational History  . Not on file.   Social  History Main Topics  . Smoking status: Former Smoker    Packs/day: 4.00    Years: 25.00  . Smokeless tobacco: Not on file  . Alcohol use No  . Drug use: No  . Sexual activity: Not on file      History  Smoking Status  . Former Smoker  . Packs/day: 4.00  . Years: 25.00  Smokeless Tobacco  . Not on file    History  Alcohol Use No     Allergies  Allergen Reactions  . Phenergan [Promethazine Hcl] Other (See Comments)    HYPER, UNABLE TO SLEEP  . Vitamin D Analogs Other (See Comments)    CONSTIPATION    Current Outpatient Prescriptions  Medication Sig Dispense Refill  . terazosin (HYTRIN) 5 MG capsule Take 5 mg by mouth at bedtime.    Marland Kitchen tetrahydrozoline (EYE DROPS) 0.05 % ophthalmic solution Place 1 drop into both eyes daily. [facility]     No current facility-administered medications for this visit.       Review of Systems:     Cardiac Review of Systems: Y or N  Chest Pain [  n  ]  Resting SOB [ n  ] Exertional SOB  Blue.Reese  ]  Orthopnea [  ]   Pedal Edema [   ]    Palpitations [  ] Syncope  [  ]   Presyncope [   ]  General Review of Systems: [Y] = yes [  ]=no Constitional: recent weight change [  ];  Wt loss over the last 3 months [   ] anorexia [  ]; fatigue [  ]; nausea [  ]; night sweats [  ];  fever [  ]; or chills [  ];          Dental: poor dentition[  ]; Last Dentist visit:   Eye : blurred vision [  ]; diplopia [   ]; vision changes [  ];  Amaurosis fugax[  ]; Resp: cough Blue.Reese  ];  wheezing[n  ];  hemoptysis[ n ]; shortness of breath[ n ]; paroxysmal nocturnal dyspnea[  ]; dyspnea on exertion[  ]; or orthopnea[  ];  GI:  gallstones[  ], vomiting[  ];  dysphagia[  ]; melena[  ];  hematochezia [  ]; heartburn[  ];   Hx of  Colonoscopy[  ]; GU: kidney stones [  ]; hematuria[  ];   dysuria [  ];  nocturia[  ];  history of     obstruction [  ]; urinary frequency [  ]             Skin: rash, swelling[  ];, hair loss[  ];  peripheral edema[  ];  or itching[  ]; Musculosketetal: myalgias[  ];  joint swelling[  ];  joint erythema[  ];  joint pain[  ];  back pain[  ];  Heme/Lymph: bruising[  ];  bleeding[  ];  anemia[  ];  Neuro: TIA[  ];  headaches[  ];  stroke[  ];  vertigo[  ];  seizures[  ];   paresthesias[  ];  difficulty walking[ n ];  Psych:depression[  ]; anxiety[  ];  Endocrine: diabetes[  ];  thyroid dysfunction[  ];  Immunizations: Flu up to date [ y ]; Pneumococcal up to date Blue.Reese  ];  Other:  Physical Exam: BP (!) 145/80   Pulse 79   Resp 20   Ht '5\' 6"'$  (1.676 m)   Wt 160  lb (72.6 kg)   SpO2 95% Comment: RA  BMI 25.82 kg/m   PHYSICAL EXAMINATION:  Physical Exam  Constitutional: He is oriented to person, place, and time and well-developed, well-nourished, and in no distress.  HENT:  Head: Normocephalic.  Eyes: Pupils are equal, round, and reactive to light.  Neck: Normal range of motion. Neck supple. No JVD present. No tracheal deviation present. No thyromegaly present.  Cardiovascular: Normal rate.  PMI is not displaced.   No murmur heard. Pulmonary/Chest: No stridor. No respiratory distress. He has no wheezes. He has no rales.  Abdominal: He exhibits no distension. There is no tenderness.  Musculoskeletal: Normal range of motion.  Lymphadenopathy:    He has no  cervical adenopathy.    He has no axillary adenopathy.       Right: No inguinal and no supraclavicular adenopathy present.       Left: No inguinal and no supraclavicular adenopathy present.  Neurological: He is alert and oriented to person, place, and time.  Skin: Skin is warm and dry.  Psychiatric: Affect normal.     Diagnostic Studies & Laboratory data:     Recent Radiology Findings:   Ct Chest W Contrast  Result Date: 03/17/2016 CLINICAL DATA:  Abnormal chest x-ray 03/07/2016 EXAM: CT CHEST WITH CONTRAST TECHNIQUE: Multidetector CT imaging of the chest was performed during intravenous contrast administration. CONTRAST:  70m ISOVUE-300 IOPAMIDOL (ISOVUE-300) INJECTION 61% COMPARISON:  None. FINDINGS: Cardiovascular: Atherosclerotic calcifications of thoracic aorta. Heart size within normal limits. No pericardial effusion. Mediastinum/Nodes: Tiny hiatal hernia is noted. There is a left upper mediastinal lymph node just anterior to main pulmonary artery measures 1.8 cm highly suspicious for metastatic adenopathy. No right hilar adenopathy is noted. No sub- carinal adenopathy. Trachea and bilateral main bronchus is patent. Axial image 60 there is left upper lobe confluent mass or adenopathy abutting the left anterior mediastinum measures at least 4 cm. This is highly suspicious for mass. Second left anterior mediastinal lymph node measures 1.2 cm suspicious for metastatic adenopathy. Lungs/Pleura: There is partial obstruction of the left upper lobe anteromedial bronchus. There is postobstructive pneumonia or infiltrative tumor spread in left upper lobe anteromedially axial image 62. Mild peripheral pleural enhancement suspicious for pleural involvement. Further evaluation with bronchoscopy and PET scan and surgical thoracic consult is recommended. There is small left posterior pleural effusion. Mild emphysematous changes noted bilateral upper lobes. Mild with left basilar posterior pleural thickening.  Upper Abdomen: The visualized upper abdomen shows indeterminate low-density lesion within left hepatic lobe measures 9.5 mm. A there is a low-density lesion in right hepatic dome anteriorly. Metastatic disease cannot be excluded. No adrenal gland mass. Visualized pancreas and spleen is unremarkable. Musculoskeletal: No destructive bony lesions are noted. Sagittal images of the spine show mild degenerative change thoracic spine. Sagittal view of the sternum is unremarkable. No rib lesions are identified. IMPRESSION: 1. There is confluent mass or adenopathy in left upper lobe suprahilar region anteriorly abutting the mediastinum measures at least 4 cm. At least 2 left anterior mediastinal lymph nodes are noted suspicious for metastatic disease. This is highly suspicious for malignancy. Further correlation with bronchoscopy PET scan and thoracic surgical consult is recommended. There is postobstructive pneumonia or tumor spread in left upper lobe anteromedially. There is mild pleural enhancement. Pleural metastatic disease cannot be excluded. Partial obstruction of the left upper lobe anteromedial bronchus. 2. No sub- carinal or right hilar adenopathy. 3. The right lung is clear. Small left pleural effusion left basilar posterior atelectasis.  There is mild pleural thickening in left base posteriorly laterally. 4. Indeterminate low-density lesions within liver the largest measures 9.5 mm in left hepatic lobe. Further evaluation with enhanced CT or MRI is recommended to exclude metastatic disease. No adrenal gland mass. 5. Degenerative changes thoracic spine. No destructive bony lesions are noted. Electronically Signed   By: Lahoma Crocker M.D.   On: 03/17/2016 08:30     I have independently reviewed the above radiologic studies.  Recent Lab Findings: No results found for: WBC, HGB, HCT, PLT, GLUCOSE, CHOL, TRIG, HDL, LDLDIRECT, LDLCALC, ALT, AST, NA, K, CL, CREATININE, BUN, CO2, TSH, INR, GLUF,  HGBA1C    Assessment / Plan:   Suspect advanced age lung cancer left upper lobe with partial lung. This diagnosis has been discussed with the patient and his family in detail. Plan to obtain an MRI and PET scan to fully stage the patient and determine the best strategy for a tissue diagnosis. Following MRI and PET scan he return to the office. I suspected a portion of the mass evident on CT is collapsed lung in the best diagnostic approach will be with bronchoscopy and biopsy of the central lesion. Suspect T2a N2 M0 disease (stage IIIA)  Plan MRI of the brain and PET scan to fully stage and determine strategy for tissue diagnosis      I  spent 40 minutes counseling the patient face to face and 50% or more the  time was spent in counseling and coordination of care. The total time spent in the appointment was 60 minutes.  Grace Isaac MD      [address] [place] [address] Office [phone]   Beeper [phone]  03/29/2016 5:15 PM

## 2016-03-26 ENCOUNTER — Other Ambulatory Visit: Payer: Self-pay | Admitting: *Deleted

## 2016-03-26 DIAGNOSIS — I10 Essential (primary) hypertension: Secondary | ICD-10-CM | POA: Diagnosis not present

## 2016-03-26 DIAGNOSIS — E782 Mixed hyperlipidemia: Secondary | ICD-10-CM | POA: Diagnosis not present

## 2016-03-26 DIAGNOSIS — R918 Other nonspecific abnormal finding of lung field: Secondary | ICD-10-CM

## 2016-03-30 ENCOUNTER — Encounter
Admission: RE | Admit: 2016-03-30 | Discharge: 2016-03-30 | Disposition: A | Discharge status: Home / Self Care | Payer: Medicare HMO | Source: Ambulatory Visit | Attending: Cardiothoracic Surgery | Admitting: Cardiothoracic Surgery

## 2016-03-30 ENCOUNTER — Encounter: Payer: Medicare HMO | Admitting: Cardiothoracic Surgery

## 2016-03-30 DIAGNOSIS — R918 Other nonspecific abnormal finding of lung field: Secondary | ICD-10-CM | POA: Diagnosis not present

## 2016-03-30 DIAGNOSIS — J984 Other disorders of lung: Secondary | ICD-10-CM | POA: Diagnosis not present

## 2016-03-30 LAB — GLUCOSE, CAPILLARY: Glucose-Capillary: 93 mg/dL (ref 65–99)

## 2016-03-30 MED ORDER — FLUDEOXYGLUCOSE F - 18 (FDG) INJECTION
12.0000 | Freq: Once | INTRAVENOUS | Status: AC | PRN
  Administered 2016-03-30: 12.8 via INTRAVENOUS

## 2016-03-31 DIAGNOSIS — C3412 Malignant neoplasm of upper lobe, left bronchus or lung: Secondary | ICD-10-CM | POA: Diagnosis not present

## 2016-03-31 DIAGNOSIS — I700 Atherosclerosis of aorta: Secondary | ICD-10-CM | POA: Diagnosis not present

## 2016-03-31 DIAGNOSIS — M19012 Primary osteoarthritis, left shoulder: Secondary | ICD-10-CM | POA: Diagnosis not present

## 2016-03-31 DIAGNOSIS — L719 Rosacea, unspecified: Secondary | ICD-10-CM | POA: Diagnosis not present

## 2016-03-31 DIAGNOSIS — E559 Vitamin D deficiency, unspecified: Secondary | ICD-10-CM | POA: Diagnosis not present

## 2016-03-31 DIAGNOSIS — I10 Essential (primary) hypertension: Secondary | ICD-10-CM | POA: Diagnosis not present

## 2016-03-31 DIAGNOSIS — Z0001 Encounter for general adult medical examination with abnormal findings: Secondary | ICD-10-CM | POA: Diagnosis not present

## 2016-03-31 DIAGNOSIS — E782 Mixed hyperlipidemia: Secondary | ICD-10-CM | POA: Diagnosis not present

## 2016-04-01 ENCOUNTER — Encounter: Payer: Self-pay | Admitting: Cardiothoracic Surgery

## 2016-04-01 ENCOUNTER — Ambulatory Visit (HOSPITAL_COMMUNITY)
Admission: RE | Admit: 2016-04-01 | Discharge: 2016-04-01 | Disposition: A | Discharge status: Home / Self Care | Payer: Medicare HMO | Source: Ambulatory Visit | Attending: Cardiothoracic Surgery | Admitting: Cardiothoracic Surgery

## 2016-04-01 ENCOUNTER — Ambulatory Visit (INDEPENDENT_AMBULATORY_CARE_PROVIDER_SITE_OTHER): Payer: Medicare HMO | Admitting: Cardiothoracic Surgery

## 2016-04-01 VITALS — BP 136/78 | HR 90 | Resp 20 | Ht 66.0 in | Wt 160.0 lb

## 2016-04-01 DIAGNOSIS — R918 Other nonspecific abnormal finding of lung field: Secondary | ICD-10-CM

## 2016-04-01 DIAGNOSIS — G9389 Other specified disorders of brain: Secondary | ICD-10-CM | POA: Diagnosis not present

## 2016-04-01 LAB — POCT I-STAT CREATININE: Creatinine, Ser: 0.8 mg/dL (ref 0.61–1.24)

## 2016-04-01 MED ORDER — GADOBENATE DIMEGLUMINE 529 MG/ML IV SOLN
15.0000 mL | Freq: Once | INTRAVENOUS | Status: AC | PRN
  Administered 2016-04-01: 15 mL via INTRAVENOUS

## 2016-04-01 NOTE — Progress Notes (Signed)
[address]       [place] [address]             [phone]                    Tyler Pitts [facility] Medical Record #[id number] Date of Birth: [birth year]  Referring: Tyler Labrum, MD Primary Care: Tyler Labrum, MD  Chief Complaint:    Chief Complaint  Patient presents with  . Lung Mass    f/u to review PET Scan and MRI Brain    History of Present Illness:    Tyler Pitts 83 y.o. male is seen in the office  today for Abnormal CT of the chest.Patient notes that in February 2018 he had left chest discomfort. A chest x-ray was done and he was treated for pneumonia follow-up CT scan suggested a left hilar mass and the patient was referred to thoracic surgery.  Patient quit smoking in 1975 the day his father was diagnosed with lung cancer   Since I saw the patient several days ago PET scan has been performed and MRI of the brain. The patient comes in today to arrange for biopsy tissue diagnosis. The patient has family notes that they would like to see Tyler Pitts if possible.   Current Activity/ Functional Status:  Patient is independent with mobility/ambulation, transfers, ADL's, IADL's.   Zubrod Score: At the time of surgery this patient's most appropriate activity status/level should be described as: '[]'$     0    Normal activity, no symptoms '[x]'$     1    Restricted in physical strenuous activity but ambulatory, able to do out light work '[]'$     2    Ambulatory and capable of self care, unable to do work activities, up and about               >50 % of waking hours                              '[]'$     3    Only limited self care, in bed greater than 50% of waking hours '[]'$     4    Completely disabled, no self care, confined to bed or chair '[]'$     5    Moribund   Past Medical History:  Diagnosis Date  . Hepatic lesions 03/17/2016   PER CT CHEST  . Hilar adenopathy 03/17/2016   PER CT CHEST   . History of ERCP   . History of pancreatitis  2001  . Hypertension   . Mass of upper lobe of left lung 03/17/2016   PER CT CHEST 03/17/16  . Shortness of breath     Past Surgical History:  Procedure Laterality Date  . CHOLECYSTECTOMY    . COLONOSCOPY  04/02/2011   Procedure: COLONOSCOPY;  Surgeon: Tyler Houston, MD;  [facility];  Service: Endoscopy;  Laterality: N/A;  1030  . COLONOSCOPY N/A 05/09/2014   Procedure: COLONOSCOPY;  Surgeon: Tyler Houston, MD;  Location: [facility];  Service: Endoscopy;  Laterality: N/A;  1030  . ERCP    . Left hip surgery Left 1994   DUE TO FALL.Marland KitchenHAD BACK FX AT THE SAME TIME    Family History  Problem Relation Age of Onset  . Lung cancer Mother   . Lung cancer Father   . Colon cancer Neg Hx  Social History   Social History  . Marital status: Married    Spouse name: N/A  . Number of children: N/A  . Years of education: N/A   Occupational History  . Not on file.   Social History Main Topics  . Smoking status: Former Smoker    Packs/day: 4.00    Years: 25.00  . Smokeless tobacco: Not on file  . Alcohol use No  . Drug use: No  . Sexual activity: Not on file      History  Smoking Status  . Former Smoker  . Packs/day: 4.00  . Years: 25.00  Smokeless Tobacco  . Never Used    History  Alcohol Use No     Allergies  Allergen Reactions  . Phenergan [Promethazine Hcl] Other (See Comments)    HYPER, UNABLE TO SLEEP  . Vitamin D Analogs Other (See Comments)    CONSTIPATION    Current Outpatient Prescriptions  Medication Sig Dispense Refill  . terazosin (HYTRIN) 5 MG capsule Take 5 mg by mouth at bedtime.    Marland Kitchen tetrahydrozoline (EYE DROPS) 0.05 % ophthalmic solution Place 1 drop into both eyes daily. [facility]     No current facility-administered medications for this visit.       Review of Systems:     Cardiac Review of Systems: Y or N  Chest Pain [  n  ]  Resting SOB [ n  ] Exertional SOB  Blue.Reese  ]  Orthopnea [  ]   Pedal Edema [   ]      Palpitations [  ] Syncope  [  ]   Presyncope [   ]  General Review of Systems: [Y] = yes [  ]=no Constitional: recent weight change [  ];  Wt loss over the last 3 months [   ] anorexia [  ]; fatigue [  ]; nausea [  ]; night sweats [  ]; fever [  ]; or chills [  ];          Dental: poor dentition[  ]; Last Dentist visit:   Eye : blurred vision [  ]; diplopia [   ]; vision changes [  ];  Amaurosis fugax[  ]; Resp: cough Blue.Reese  ];  wheezing[n  ];  hemoptysis[ n ]; shortness of breath[ n ]; paroxysmal nocturnal dyspnea[  ]; dyspnea on exertion[  ]; or orthopnea[  ];  GI:  gallstones[  ], vomiting[  ];  dysphagia[  ]; melena[  ];  hematochezia [  ]; heartburn[  ];   Hx of  Colonoscopy[  ]; GU: kidney stones [  ]; hematuria[  ];   dysuria [  ];  nocturia[  ];  history of     obstruction [  ]; urinary frequency [  ]             Skin: rash, swelling[  ];, hair loss[  ];  peripheral edema[  ];  or itching[  ]; Musculosketetal: myalgias[  ];  joint swelling[  ];  joint erythema[  ];  joint pain[  ];  back pain[  ];  Heme/Lymph: bruising[  ];  bleeding[  ];  anemia[  ];  Neuro: TIA[  ];  headaches[  ];  stroke[  ];  vertigo[  ];  seizures[  ];   paresthesias[  ];  difficulty walking[ n ];  Psych:depression[  ]; anxiety[  ];  Endocrine: diabetes[  ];  thyroid dysfunction[  ];  Immunizations:  Flu up to date [ y ]; Pneumococcal up to date Blue.Reese  ];  Other:  Physical Exam: BP 136/78   Pulse 90   Resp 20   Ht '5\' 6"'$  (1.676 m)   Wt 160 lb (72.6 kg)   SpO2 94% Comment: RA  BMI 25.82 kg/m   PHYSICAL EXAMINATION:  Physical Exam  Constitutional: He is oriented to person, place, and time and well-developed, well-nourished, and in no distress.  HENT:  Head: Normocephalic.  Eyes: Pupils are equal, round, and reactive to light.  Neck: Normal range of motion. Neck supple. No JVD present. No tracheal deviation present. No thyromegaly present.  Cardiovascular: Normal rate.  PMI is not displaced.   No murmur  heard. Pulmonary/Chest: No stridor. No respiratory distress. He has no wheezes. He has no rales.  Abdominal: He exhibits no distension. There is no tenderness.  Musculoskeletal: Normal range of motion.  Lymphadenopathy:    He has no cervical adenopathy.    He has no axillary adenopathy.       Right: No inguinal and no supraclavicular adenopathy present.       Left: No inguinal and no supraclavicular adenopathy present.  Neurological: He is alert and oriented to person, place, and time.  Skin: Skin is warm and dry.  Psychiatric: Affect normal.     Diagnostic Studies & Laboratory data:     Recent Radiology Findings:   Mr Jeri Cos Wo Contrast  Result Date: 04/01/2016 CLINICAL DATA:  83 year old male with recent diagnosis of hypermetabolic left upper lobe lung mass most compatible with lung cancer. Staging. Subsequent encounter. EXAM: MRI HEAD WITHOUT AND WITH CONTRAST TECHNIQUE: Multiplanar, multiecho pulse sequences of the brain and surrounding structures were obtained without and with intravenous contrast. CONTRAST:  67m MULTIHANCE GADOBENATE DIMEGLUMINE 529 MG/ML IV SOLN COMPARISON:  PET-CT 03/30/2016 and earlier. FINDINGS: Brain: No midline shift, mass effect, or evidence of intracranial mass lesion. No abnormal enhancement identified. No dural thickening. Cerebral volume is within normal limits for age. No restricted diffusion to suggest acute infarction. No ventriculomegaly, extra-axial collection or acute intracranial hemorrhage. Cervicomedullary junction and pituitary are within normal limits. GPearline Cablesand white matter signal is within normal limits for age throughout the brain. No cortical encephalomalacia or chronic cerebral blood products. Vascular: Major intracranial vascular flow voids are preserved. Skull and upper cervical spine: Negative. Visualized bone marrow signal is within normal limits. Sinuses/Orbits: Postoperative changes to the globes, otherwise normal orbits soft tissues.  Visualized paranasal sinuses and mastoids are well pneumatized. Other: Visible internal auditory structures appear normal. Negative scalp soft tissues. IMPRESSION: No metastatic disease or acute intracranial abnormality. Normal for age MRI appearance of the brain. Electronically Signed   By: HGenevie AnnM.D.   On: 04/01/2016 11:32   Nm Pet Image Initial (pi) Skull Base To Thigh  Result Date: 03/30/2016 CLINICAL DATA:  Initial treatment strategy for left upper lobe lung mass. EXAM: NUCLEAR MEDICINE PET SKULL BASE TO THIGH TECHNIQUE: Twelve point head mCi F-18 FDG was injected intravenously. Full-ring PET imaging was performed from the skull base to thigh after the radiotracer. CT data was obtained and used for attenuation correction and anatomic localization. FASTING BLOOD GLUCOSE:  Value: 93 mg/dl COMPARISON:  CT chest 03/17/2016 FINDINGS: NECK No hypermetabolic lymph nodes in the neck. CHEST 3.3 by 4.3 cm central left upper lobe mass with maximum SUV 17.9. Adjacent adenopathy anterior to the left pulmonary artery, short axis diameter 1.5 cm, maximum SUV 18.1. Postobstructive atelectasis/ pneumonitis in the left upper  lobe. The mass significantly narrows the left superior lobar bronchus. No other hypermetabolic nodules identified. Atherosclerotic aortic arch. ABDOMEN/PELVIS No abnormal hypermetabolic activity within the liver, pancreas, adrenal glands, or spleen. No hypermetabolic lymph nodes in the abdomen or pelvis. Calcification associated with the right posterior prostatic apex has some faint but measurable hypermetabolic activity with maximum SUV 6.9. This persists on the an corrected images and accordingly I do not feel that it is only due to attenuation correction related to the calcification. Enlarged prostate gland. Aortoiliac atherosclerotic vascular disease. Faint stranding in the mesentery. SKELETON There is a left hip prosthesis noted with accentuated activity in the vicinity of the prosthesis is  specially anteriorly, probably mostly due to partially attributable to attenuation correction and partially attributable to inflammation. I am skeptical of metastatic disease to this area. IMPRESSION: 1. 4.3 cm central left upper lobe mass, maximum SUV 17.9, with adjacent adenopathy in the ipsilateral mediastinum, and associated postobstructive atelectasis/ pneumonitis in the left upper lobe. This mass significantly narrows the left superior lobar bronchus. Localized pleural/mediastinal invasion is not readily excluded given the morphology of the mass. If this is non-small cell lung cancer than appearance is compatible with T2a N2 M0 disease (stage IIIA). 2. Faint hypermetabolic activity associated with calcification in the right posterior prostatic apex. The prostate gland is enlarged. Correlate with PSA level in determining need for further workup. 3. Atherosclerosis. 4. Left hip prosthesis, activity in the vicinity of the prosthesis is likely inflammatory. Electronically Signed   By: Van Clines M.D.   On: 03/30/2016 15:37    Ct Chest W Contrast  Result Date: 03/17/2016 CLINICAL DATA:  Abnormal chest x-ray 03/07/2016 EXAM: CT CHEST WITH CONTRAST TECHNIQUE: Multidetector CT imaging of the chest was performed during intravenous contrast administration. CONTRAST:  35m ISOVUE-300 IOPAMIDOL (ISOVUE-300) INJECTION 61% COMPARISON:  None. FINDINGS: Cardiovascular: Atherosclerotic calcifications of thoracic aorta. Heart size within normal limits. No pericardial effusion. Mediastinum/Nodes: Tiny hiatal hernia is noted. There is a left upper mediastinal lymph node just anterior to main pulmonary artery measures 1.8 cm highly suspicious for metastatic adenopathy. No right hilar adenopathy is noted. No sub- carinal adenopathy. Trachea and bilateral main bronchus is patent. Axial image 60 there is left upper lobe confluent mass or adenopathy abutting the left anterior mediastinum measures at least 4 cm. This is  highly suspicious for mass. Second left anterior mediastinal lymph node measures 1.2 cm suspicious for metastatic adenopathy. Lungs/Pleura: There is partial obstruction of the left upper lobe anteromedial bronchus. There is postobstructive pneumonia or infiltrative tumor spread in left upper lobe anteromedially axial image 62. Mild peripheral pleural enhancement suspicious for pleural involvement. Further evaluation with bronchoscopy and PET scan and surgical thoracic consult is recommended. There is small left posterior pleural effusion. Mild emphysematous changes noted bilateral upper lobes. Mild with left basilar posterior pleural thickening. Upper Abdomen: The visualized upper abdomen shows indeterminate low-density lesion within left hepatic lobe measures 9.5 mm. A there is a low-density lesion in right hepatic dome anteriorly. Metastatic disease cannot be excluded. No adrenal gland mass. Visualized pancreas and spleen is unremarkable. Musculoskeletal: No destructive bony lesions are noted. Sagittal images of the spine show mild degenerative change thoracic spine. Sagittal view of the sternum is unremarkable. No rib lesions are identified. IMPRESSION: 1. There is confluent mass or adenopathy in left upper lobe suprahilar region anteriorly abutting the mediastinum measures at least 4 cm. At least 2 left anterior mediastinal lymph nodes are noted suspicious for metastatic disease. This is highly suspicious  for malignancy. Further correlation with bronchoscopy PET scan and thoracic surgical consult is recommended. There is postobstructive pneumonia or tumor spread in left upper lobe anteromedially. There is mild pleural enhancement. Pleural metastatic disease cannot be excluded. Partial obstruction of the left upper lobe anteromedial bronchus. 2. No sub- carinal or right hilar adenopathy. 3. The right lung is clear. Small left pleural effusion left basilar posterior atelectasis. There is mild pleural thickening in  left base posteriorly laterally. 4. Indeterminate low-density lesions within liver the largest measures 9.5 mm in left hepatic lobe. Further evaluation with enhanced CT or MRI is recommended to exclude metastatic disease. No adrenal gland mass. 5. Degenerative changes thoracic spine. No destructive bony lesions are noted. Electronically Signed   By: Lahoma Crocker M.D.   On: 03/17/2016 08:30     I have independently reviewed the above radiologic studies.  Recent Lab Findings: Lab Results  Component Value Date   CREATININE 0.80 04/01/2016      Assessment / Plan:   Suspect advanced Stage  lung cancer left upper lobe with partial lung collapse . This diagnosis has been discussed with the patient and his family in detail.  I suspected a portion of the mass evident on CT is collapsed lung in the best diagnostic approach will be with bronchoscopy and biopsy of the central lesion. Suspect T2a N2 M0 disease (stage IIIA)  I discussed with the patient and his family proceeding with bronchoscopy with ebus to obtain a tissue diagnosis. We'll plan procedure for Tuesday, March 27. Risks and options for obtaining tissue diagnosis with bronchoscopy nevus was discussed with the patient. Patient is concerned about urinary issues after surgery, because he developed significant urinary retention after a fall and fractured hip.     Grace Isaac MD      [address] [place] [address] Office [phone]   Beeper [phone]  04/01/2016 4:34 PM

## 2016-04-01 NOTE — Patient Instructions (Signed)
Flexible Bronchoscopy Bronchoscopy is a procedure used to examine the passageways in the lungs. During the procedure a thin, flexible tool with a lens and camera or eyepiece is passed in your mouth or nose, down the windpipe (trachea), and into the air tubes (bronchi). This tool allows your health care provider to carefully look at your lungs from the inside and take diagnostic samples if needed. Tell a health care provider about:  Allergies to food or medicine.  All medicines you are taking, including blood thinners, vitamins, herbs, eye drops, creams, and over-the-counter medicines.  Any problems you or family members have had with anesthetic medicines.  Any blood disorders you have.  Any surgeries you have had.  Medical conditions you have, including heart disease, diabetes, or kidney problems.  Possibility of pregnancy, if this applies. What are the risks? Generally, this is a safe procedure. However, as with any procedure, problems can occur. Possible problems include:  Collapsed lung (pneumothorax).  Bleeding.  Increased need for oxygen or difficulty breathing after the procedure. What happens before the procedure? Do not eat or drink anything after midnight on the night before the procedure or as directed by your health care provider. What happens during the procedure?  Relax as much as possible during the procedure.  Medicines may be given to relax you, dry up your secretions, and control coughing.  A numbing medicine (local anesthetic) will be given to numb your mouth, nose, throat, and voice box (larynx). You will be able to breathe normally during the procedure.  Samples of airway secretions may be collected for testing.  If abnormal areas are seen in your airways, tissue samples may be taken for examination under a microscope (biopsy).  If tissue samples are needed from the outer portions of the lung, a type of X-ray called fluoroscopy may be done.  If bleeding  occurs, a drug may be used to stop or decrease the bleeding. What happens after the procedure?  You may receive a chest X-ray following the procedure. This is to make sure the lungs have not collapsed (pneumothorax). This information is not intended to replace advice given to you by your health care provider. Make sure you discuss any questions you have with your health care provider. Document Released: 12/26/1999 Document Revised: 06/05/2015 Document Reviewed: 09/01/2012 Elsevier Interactive Patient Education  2017 [employer].   Flexible Bronchoscopy, Care After This sheet gives you information about how to care for yourself after your procedure. Your health care provider may also give you more specific instructions. If you have problems or questions, contact your health care provider. What can I expect after the procedure? After the procedure, it is common to have the following symptoms for 24-48 hours:  A cough that is worse than it was before the procedure.  A low-grade fever.  A sore throat or hoarse voice.  Small streaks of blood in the mucus from your lungs (sputum), if tissue samples were removed (biopsy). Follow these instructions at home: Eating and drinking   Do not eat or drink anything (including water) for 2 hours after your procedure, or until your numbing medicine (local anesthetic) has worn off. Having a numb throat increases your risk of burning yourself or choking.  After your numbness is gone and your cough and gag reflexes have returned, you may start eating only soft foods and slowly drinking liquids.  The day after the procedure, return to your normal diet. Driving   Do not drive for 24 hours if you were  given a medicine to help you relax (sedative).  Do not drive or use heavy machinery while taking prescription pain medicine. General instructions   Take over-the-counter and prescription medicines only as told by your health care provider.  Return to  your normal activities as told by your health care provider. Ask your health care provider what activities are safe for you.  Do not use any products that contain nicotine or tobacco, such as cigarettes and e-cigarettes. If you need help quitting, ask your health care provider.  Keep all follow-up visits as told by your health care provider. This is important, especially if you had a biopsy taken. Get help right away if:  You have shortness of breath that gets worse.  You become light-headed or feel like you might faint.  You have chest pain.  You cough up more than a small amount of blood.  The amount of blood you cough up increases. Summary  Common symptoms in the 24-48 hours following a flexible bronchoscopy include cough, low-grade fever, sore throat or hoarse voice, and blood-streaked mucus from the lungs (if you had a biopsy).  Do not eat or drink anything (including water) for 2 hours after your procedure, or until your local anesthetic has worn off. You can return to your normal diet the day after the procedure.  Get help right away if you develop worsening shortness of breath, have chest pain, become light-headed, or cough up more than a small amount of blood. This information is not intended to replace advice given to you by your health care provider. Make sure you discuss any questions you have with your health care provider. Document Released: 07/17/2004 Document Revised: 01/16/2016 Document Reviewed: 01/16/2016 Elsevier Interactive Patient Education  2017 [employer].

## 2016-04-02 ENCOUNTER — Other Ambulatory Visit: Payer: Self-pay

## 2016-04-02 DIAGNOSIS — R918 Other nonspecific abnormal finding of lung field: Secondary | ICD-10-CM

## 2016-04-02 NOTE — Pre-Procedure Instructions (Signed)
    Tyler Pitts  04/02/2016      [facility] - [place], [place] - [address] [place] [address] Phone: [phone] Fax: [phone]  [id number] [place] [place]: [phone]: [phone]          Your procedure is scheduled on Tues. Mar. 27 at 730 AM         [facility]  Call this number if you have problems the morning of surgery: [phone]   Remember:  Do not eat food or drink liquids after midnight.  Take these medicines the morning of surgery with A SIP OF WATER tetrahydrozoline (eye drops).   Do not wear jewelry, make-up or nail polish.  Do not wear lotions, powders, or perfumes, or deoderant.   Men may shave face and neck.  Do not bring valuables to the hospital.  [facility] not responsible for any belongings or valuables.  Contacts, dentures or bridgework may not be worn into surgery.  Leave your suitcase in the car.  After surgery it may be brought to your room.  For patients admitted to the hospital, discharge time will be determined by your treatment team.  Patients discharged the day of surgery will not be allowed to drive home.    Special instructions: see attached  Please read over the following fact sheets that you were given. Pain Booklet, Coughing and Deep Breathing, MRSA Information and Surgical Site Infection Prevention

## 2016-04-05 ENCOUNTER — Ambulatory Visit (HOSPITAL_COMMUNITY)
Admission: RE | Admit: 2016-04-05 | Discharge: 2016-04-05 | Disposition: A | Discharge status: Home / Self Care | Payer: Medicare HMO | Source: Ambulatory Visit | Attending: Cardiothoracic Surgery | Admitting: Cardiothoracic Surgery

## 2016-04-05 ENCOUNTER — Encounter (HOSPITAL_COMMUNITY): Payer: Self-pay

## 2016-04-05 ENCOUNTER — Encounter (HOSPITAL_COMMUNITY)
Admission: RE | Admit: 2016-04-05 | Discharge: 2016-04-05 | Disposition: A | Discharge status: Home / Self Care | Payer: Medicare HMO | Source: Ambulatory Visit | Attending: Cardiothoracic Surgery | Admitting: Cardiothoracic Surgery

## 2016-04-05 DIAGNOSIS — I10 Essential (primary) hypertension: Secondary | ICD-10-CM | POA: Diagnosis not present

## 2016-04-05 DIAGNOSIS — R918 Other nonspecific abnormal finding of lung field: Secondary | ICD-10-CM | POA: Insufficient documentation

## 2016-04-05 DIAGNOSIS — E782 Mixed hyperlipidemia: Secondary | ICD-10-CM | POA: Diagnosis not present

## 2016-04-05 DIAGNOSIS — Z01818 Encounter for other preprocedural examination: Secondary | ICD-10-CM | POA: Insufficient documentation

## 2016-04-05 DIAGNOSIS — Z6828 Body mass index (BMI) 28.0-28.9, adult: Secondary | ICD-10-CM | POA: Diagnosis not present

## 2016-04-05 DIAGNOSIS — I700 Atherosclerosis of aorta: Secondary | ICD-10-CM | POA: Insufficient documentation

## 2016-04-05 DIAGNOSIS — J189 Pneumonia, unspecified organism: Secondary | ICD-10-CM | POA: Diagnosis not present

## 2016-04-05 HISTORY — DX: Unspecified hearing loss, unspecified ear: H91.90

## 2016-04-05 HISTORY — DX: Malignant (primary) neoplasm, unspecified: C80.1

## 2016-04-05 HISTORY — DX: Personal history of urinary calculi: Z87.442

## 2016-04-05 HISTORY — DX: Pneumonia, unspecified organism: J18.9

## 2016-04-05 HISTORY — DX: Unspecified osteoarthritis, unspecified site: M19.90

## 2016-04-05 HISTORY — DX: Gastro-esophageal reflux disease without esophagitis: K21.9

## 2016-04-05 LAB — COMPREHENSIVE METABOLIC PANEL
ALT: 16 U/L — ABNORMAL LOW (ref 17–63)
AST: 17 U/L (ref 15–41)
Albumin: 3.7 g/dL (ref 3.5–5.0)
Alkaline Phosphatase: 82 U/L (ref 38–126)
Anion gap: 9 (ref 5–15)
BUN: 15 mg/dL (ref 6–20)
CO2: 26 mmol/L (ref 22–32)
Calcium: 9.2 mg/dL (ref 8.9–10.3)
Chloride: 107 mmol/L (ref 101–111)
Creatinine, Ser: 0.76 mg/dL (ref 0.61–1.24)
GFR calc Af Amer: >60 mL/min (ref >60)
GFR calc non Af Amer: >60 mL/min (ref >60)
Glucose, Bld: 105 mg/dL — ABNORMAL HIGH (ref 65–99)
Potassium: 3.9 mmol/L (ref 3.5–5.1)
Sodium: 142 mmol/L (ref 135–145)
Total Bilirubin: 1.3 mg/dL — ABNORMAL HIGH (ref 0.3–1.2)
Total Protein: 7.2 g/dL (ref 6.5–8.1)

## 2016-04-05 LAB — PROTIME-INR
INR: 1.07
Prothrombin Time: 14 seconds (ref 11.4–15.2)

## 2016-04-05 LAB — CBC
HCT: 42.3 % (ref 39.0–52.0)
Hemoglobin: 13.9 g/dL (ref 13.0–17.0)
MCH: 28 pg (ref 26.0–34.0)
MCHC: 32.9 g/dL (ref 30.0–36.0)
MCV: 85.1 fL (ref 78.0–100.0)
Platelets: 201 10*3/uL (ref 150–400)
RBC: 4.97 MIL/uL (ref 4.22–5.81)
RDW: 14.4 % (ref 11.5–15.5)
WBC: 8 10*3/uL (ref 4.0–10.5)

## 2016-04-05 LAB — APTT: aPTT: 28 seconds (ref 24–36)

## 2016-04-05 NOTE — Progress Notes (Addendum)
Patient and wife both state that after each of his surgeries, his bladder takes a while to wake up. PCP is Dr. Lizbeth Bark @ [facility] in [place] 3/21 Denies seeing a cardio, no chest pains or discomfort.  Did have h/o of pneumonia.  Wife extremely displeased over the constant phone calls, (4 in one day) change of surgery time.

## 2016-04-06 ENCOUNTER — Ambulatory Visit (HOSPITAL_COMMUNITY)
Admission: RE | Admit: 2016-04-06 | Discharge: 2016-04-06 | Disposition: A | Discharge status: Home / Self Care | Payer: Medicare HMO | Source: Ambulatory Visit | Attending: Cardiothoracic Surgery | Admitting: Cardiothoracic Surgery

## 2016-04-06 ENCOUNTER — Ambulatory Visit (HOSPITAL_COMMUNITY): Payer: Medicare HMO | Admitting: Vascular Surgery

## 2016-04-06 ENCOUNTER — Ambulatory Visit (HOSPITAL_COMMUNITY): Payer: Medicare HMO | Admitting: Certified Registered Nurse Anesthetist

## 2016-04-06 ENCOUNTER — Encounter (HOSPITAL_COMMUNITY)
Admission: RE | Disposition: A | Discharge status: Home / Self Care | Payer: Self-pay | Source: Ambulatory Visit | Attending: Cardiothoracic Surgery

## 2016-04-06 ENCOUNTER — Encounter (HOSPITAL_COMMUNITY): Payer: Self-pay | Admitting: *Deleted

## 2016-04-06 DIAGNOSIS — Z801 Family history of malignant neoplasm of trachea, bronchus and lung: Secondary | ICD-10-CM | POA: Insufficient documentation

## 2016-04-06 DIAGNOSIS — C3412 Malignant neoplasm of upper lobe, left bronchus or lung: Secondary | ICD-10-CM | POA: Diagnosis not present

## 2016-04-06 DIAGNOSIS — I10 Essential (primary) hypertension: Secondary | ICD-10-CM | POA: Insufficient documentation

## 2016-04-06 DIAGNOSIS — C771 Secondary and unspecified malignant neoplasm of intrathoracic lymph nodes: Secondary | ICD-10-CM | POA: Diagnosis not present

## 2016-04-06 DIAGNOSIS — R918 Other nonspecific abnormal finding of lung field: Secondary | ICD-10-CM | POA: Diagnosis not present

## 2016-04-06 DIAGNOSIS — C801 Malignant (primary) neoplasm, unspecified: Secondary | ICD-10-CM | POA: Diagnosis not present

## 2016-04-06 DIAGNOSIS — K219 Gastro-esophageal reflux disease without esophagitis: Secondary | ICD-10-CM | POA: Diagnosis not present

## 2016-04-06 DIAGNOSIS — R0602 Shortness of breath: Secondary | ICD-10-CM | POA: Diagnosis not present

## 2016-04-06 DIAGNOSIS — Z87891 Personal history of nicotine dependence: Secondary | ICD-10-CM | POA: Diagnosis not present

## 2016-04-06 DIAGNOSIS — R222 Localized swelling, mass and lump, trunk: Secondary | ICD-10-CM | POA: Diagnosis not present

## 2016-04-06 HISTORY — PX: VIDEO BRONCHOSCOPY WITH ENDOBRONCHIAL ULTRASOUND: SHX6177

## 2016-04-06 SURGERY — BRONCHOSCOPY, WITH EBUS
Anesthesia: General

## 2016-04-06 MED ORDER — ROCURONIUM BROMIDE 50 MG/5ML IV SOSY
PREFILLED_SYRINGE | INTRAVENOUS | Status: AC
  Filled 2016-04-06: qty 5

## 2016-04-06 MED ORDER — PHENYLEPHRINE HCL 10 MG/ML IJ SOLN
INTRAVENOUS | Status: DC | PRN
  Administered 2016-04-06: 40 ug/min via INTRAVENOUS

## 2016-04-06 MED ORDER — ROCURONIUM BROMIDE 100 MG/10ML IV SOLN
INTRAVENOUS | Status: DC | PRN
  Administered 2016-04-06: 40 mg via INTRAVENOUS

## 2016-04-06 MED ORDER — ESMOLOL HCL 100 MG/10ML IV SOLN
INTRAVENOUS | Status: AC
  Filled 2016-04-06: qty 10

## 2016-04-06 MED ORDER — 0.9 % SODIUM CHLORIDE (POUR BTL) OPTIME: TOPICAL | Status: DC | PRN

## 2016-04-06 MED ORDER — PHENYLEPHRINE 40 MCG/ML (10ML) SYRINGE FOR IV PUSH (FOR BLOOD PRESSURE SUPPORT)
PREFILLED_SYRINGE | INTRAVENOUS | Status: AC
  Filled 2016-04-06: qty 10

## 2016-04-06 MED ORDER — ESMOLOL HCL 100 MG/10ML IV SOLN
INTRAVENOUS | Status: DC | PRN
  Administered 2016-04-06: 5 mg via INTRAVENOUS

## 2016-04-06 MED ORDER — EPHEDRINE 5 MG/ML INJ
INTRAVENOUS | Status: AC
  Filled 2016-04-06: qty 10

## 2016-04-06 MED ORDER — ONDANSETRON HCL 4 MG/2ML IJ SOLN
INTRAMUSCULAR | Status: AC
  Filled 2016-04-06: qty 2

## 2016-04-06 MED ORDER — FENTANYL CITRATE (PF) 100 MCG/2ML IJ SOLN: 25.0000 ug | INTRAMUSCULAR | Status: DC | PRN

## 2016-04-06 MED ORDER — LIDOCAINE 2% (20 MG/ML) 5 ML SYRINGE
INTRAMUSCULAR | Status: AC
  Filled 2016-04-06: qty 5

## 2016-04-06 MED ORDER — EPHEDRINE SULFATE 50 MG/ML IJ SOLN
INTRAMUSCULAR | Status: DC | PRN
Start: 2016-04-06 — End: 2016-04-06
  Administered 2016-04-06 (×2): 5 mg via INTRAVENOUS

## 2016-04-06 MED ORDER — EPINEPHRINE PF 1 MG/ML IJ SOLN
INTRAMUSCULAR | Status: AC
  Filled 2016-04-06: qty 1

## 2016-04-06 MED ORDER — PHENYLEPHRINE HCL 10 MG/ML IJ SOLN
INTRAMUSCULAR | Status: DC | PRN
  Administered 2016-04-06 (×5): 40 ug via INTRAVENOUS

## 2016-04-06 MED ORDER — FENTANYL CITRATE (PF) 100 MCG/2ML IJ SOLN
INTRAMUSCULAR | Status: DC | PRN
  Administered 2016-04-06: 50 ug via INTRAVENOUS
  Administered 2016-04-06: 25 ug via INTRAVENOUS

## 2016-04-06 MED ORDER — ONDANSETRON HCL 4 MG/2ML IJ SOLN
INTRAMUSCULAR | Status: DC | PRN
  Administered 2016-04-06: 4 mg via INTRAVENOUS

## 2016-04-06 MED ORDER — SODIUM CHLORIDE 0.9 % IR SOLN
Status: DC | PRN
  Administered 2016-04-06: 14:00:00

## 2016-04-06 MED ORDER — LACTATED RINGERS IV SOLN
Freq: Once | INTRAVENOUS | Status: AC
  Administered 2016-04-06: 15:00:00 via INTRAVENOUS

## 2016-04-06 MED ORDER — GLYCOPYRROLATE 0.2 MG/ML IJ SOLN
INTRAMUSCULAR | Status: DC | PRN
  Administered 2016-04-06: 0.2 mg via INTRAVENOUS

## 2016-04-06 MED ORDER — LIDOCAINE HCL (CARDIAC) 20 MG/ML IV SOLN
INTRAVENOUS | Status: DC | PRN
  Administered 2016-04-06: 100 mg via INTRAVENOUS

## 2016-04-06 MED ORDER — SUGAMMADEX SODIUM 200 MG/2ML IV SOLN
INTRAVENOUS | Status: AC
  Filled 2016-04-06: qty 2

## 2016-04-06 MED ORDER — CEFAZOLIN IN D5W 1 GM/50ML IV SOLN
INTRAVENOUS | Status: DC | PRN
  Administered 2016-04-06: 1 g via INTRAVENOUS

## 2016-04-06 MED ORDER — FENTANYL CITRATE (PF) 250 MCG/5ML IJ SOLN
INTRAMUSCULAR | Status: AC
  Filled 2016-04-06: qty 5

## 2016-04-06 MED ORDER — SUGAMMADEX SODIUM 200 MG/2ML IV SOLN
INTRAVENOUS | Status: DC | PRN
  Administered 2016-04-06: 200 mg via INTRAVENOUS

## 2016-04-06 MED ORDER — PROPOFOL 10 MG/ML IV BOLUS
INTRAVENOUS | Status: DC | PRN
  Administered 2016-04-06: 30 mg via INTRAVENOUS
  Administered 2016-04-06: 110 mg via INTRAVENOUS

## 2016-04-06 SURGICAL SUPPLY — 31 items
BRUSH CYTOL CELLEBRITY 1.5X140 (MISCELLANEOUS) ×1 IMPLANT
CANISTER SUCT 3000ML PPV (MISCELLANEOUS) ×2 IMPLANT
CONT SPEC 4OZ CLIKSEAL STRL BL (MISCELLANEOUS) ×3 IMPLANT
COVER BACK TABLE 60X90IN (DRAPES) ×2 IMPLANT
COVER DOME SNAP 22 D (MISCELLANEOUS) ×2 IMPLANT
FORCEPS BIOP RJ4 1.8 (CUTTING FORCEPS) IMPLANT
FORCEPS BIOP SPYBITE 1.2X286 (FORCEP) ×1 IMPLANT
FORCEPS BIOP SUPERTRX PREMAR (INSTRUMENTS) ×1 IMPLANT
FORCEPS RADIAL JAW LRG 4 PULM (INSTRUMENTS) IMPLANT
GAUZE SPONGE 4X4 12PLY STRL (GAUZE/BANDAGES/DRESSINGS) ×1 IMPLANT
GLOVE BIO SURGEON STRL SZ 6.5 (GLOVE) ×5 IMPLANT
KIT CLEAN ENDO COMPLIANCE (KITS) ×5 IMPLANT
KIT ROOM TURNOVER OR (KITS) ×2 IMPLANT
MARKER SKIN DUAL TIP RULER LAB (MISCELLANEOUS) ×2 IMPLANT
NDL BIOPSY TRANSBRONCH 21G (NEEDLE) IMPLANT
NDL BLUNT 18X1 FOR OR ONLY (NEEDLE) IMPLANT
NDL EBUS SONO TIP PENTAX (NEEDLE) ×1 IMPLANT
NEEDLE BIOPSY TRANSBRONCH 21G (NEEDLE) IMPLANT
NEEDLE BLUNT 18X1 FOR OR ONLY (NEEDLE) IMPLANT
NEEDLE EBUS SONO TIP PENTAX (NEEDLE) ×2 IMPLANT
NS IRRIG 1000ML POUR BTL (IV SOLUTION) ×2 IMPLANT
OIL SILICONE PENTAX (PARTS (SERVICE/REPAIRS)) ×2 IMPLANT
PAD ARMBOARD 7.5X6 YLW CONV (MISCELLANEOUS) ×4 IMPLANT
RADIAL JAW LRG 4 PULMONARY (INSTRUMENTS) ×1
SYR 20CC LL (SYRINGE) ×2 IMPLANT
SYR 20ML ECCENTRIC (SYRINGE) ×2 IMPLANT
SYR 30ML SLIP (SYRINGE) ×1 IMPLANT
TOWEL OR 17X24 6PK STRL BLUE (TOWEL DISPOSABLE) ×1 IMPLANT
TRAP SPECIMEN MUCOUS 40CC (MISCELLANEOUS) ×2 IMPLANT
TUBE CONNECTING 20X1/4 (TUBING) ×2 IMPLANT
WATER STERILE IRR 1000ML POUR (IV SOLUTION) ×2 IMPLANT

## 2016-04-06 NOTE — Anesthesia Preprocedure Evaluation (Signed)
Anesthesia Evaluation  Patient identified by MRN, date of birth, ID band Patient awake    Airway Mallampati: II  TM Distance: >3 FB Neck ROM: Full    Dental  (+) Teeth Intact, Caps   Pulmonary shortness of breath, former smoker,  Lung mass    breath sounds clear to auscultation       Cardiovascular hypertension,  Rhythm:Regular Rate:Normal     Neuro/Psych    GI/Hepatic GERD  ,  Endo/Other    Renal/GU      Musculoskeletal  (+) Arthritis ,   Abdominal   Peds  Hematology   Anesthesia Other Findings   Reproductive/Obstetrics                             Anesthesia Physical Anesthesia Plan  ASA: III  Anesthesia Plan: General   Post-op Pain Management:    Induction: Intravenous  Airway Management Planned: Oral ETT  Additional Equipment:   Intra-op Plan:   Post-operative Plan: Extubation in OR  Informed Consent: I have reviewed the patients History and Physical, chart, labs and discussed the procedure including the risks, benefits and alternatives for the proposed anesthesia with the patient or authorized representative who has indicated his/her understanding and acceptance.   Dental advisory given  Plan Discussed with: CRNA  Anesthesia Plan Comments:         Anesthesia Quick Evaluation

## 2016-04-06 NOTE — Brief Op Note (Addendum)
      [address]       [place] [address]             [phone]      04/06/2016  3:47 PM  PATIENT:  Tyler Pitts  83 y.o. male  PRE-OPERATIVE DIAGNOSIS:  lung mass- left  POST-OPERATIVE DIAGNOSIS:  lung mass- preliminary non small cell lung cancer   PROCEDURE:  Procedure(s): VIDEO BRONCHOSCOPY WITH ENDOBRONCHIAL ULTRASOUND with lung and lymph node biopsy (N/A)  SURGEON:  Surgeon(s) and Role:    * Grace Isaac, MD - Primary   ANESTHESIA:   general  EBL:  No intake/output data recorded.  BLOOD ADMINISTERED:none  DRAINS: none   LOCAL MEDICATIONS USED:  NONE  SPECIMEN:  Source of Specimen:  # 10 Land left upper lobe bronchus  DISPOSITION OF SPECIMEN:  PATHOLOGY  COUNTS:  YES  TOURNIQUET:  * No tourniquets in log *  DICTATION: .Dragon Dictation  PLAN OF CARE: Discharge to home after PACU  PATIENT DISPOSITION:  PACU - hemodynamically stable.   Delay start of Pharmacological VTE agent (>24hrs) due to surgical blood loss or risk of bleeding: yes

## 2016-04-06 NOTE — Discharge Instructions (Signed)
Flexible Bronchoscopy, Care After °This sheet gives you information about how to care for yourself after your procedure. Your health care provider may also give you more specific instructions. If you have problems or questions, contact your health care provider. °What can I expect after the procedure? °After the procedure, it is common to have the following symptoms for 24-48 hours: °· A cough that is worse than it was before the procedure. °· A low-grade fever. °· A sore throat or hoarse voice. °· Small streaks of blood in the mucus from your lungs (sputum), if tissue samples were removed (biopsy). ° °Follow these instructions at home: °Eating and drinking °· Do not eat or drink anything (including water) for 2 hours after your procedure, or until your numbing medicine (local anesthetic) has worn off. Having a numb throat increases your risk of burning yourself or choking. °· After your numbness is gone and your cough and gag reflexes have returned, you may start eating only soft foods and slowly drinking liquids. °· The day after the procedure, return to your normal diet. °Driving °· Do not drive for 24 hours if you were given a medicine to help you relax (sedative). °· Do not drive or use heavy machinery while taking prescription pain medicine. °General instructions °· Take over-the-counter and prescription medicines only as told by your health care provider. °· Return to your normal activities as told by your health care provider. Ask your health care provider what activities are safe for you. °· Do not use any products that contain nicotine or tobacco, such as cigarettes and e-cigarettes. If you need help quitting, ask your health care provider. °· Keep all follow-up visits as told by your health care provider. This is important, especially if you had a biopsy taken. °Get help right away if: °· You have shortness of breath that gets worse. °· You become light-headed or feel like you might faint. °· You have  chest pain. °· You cough up more than a small amount of blood. °· The amount of blood you cough up increases. °Summary °· Common symptoms in the 24-48 hours following a flexible bronchoscopy include cough, low-grade fever, sore throat or hoarse voice, and blood-streaked mucus from the lungs (if you had a biopsy). °· Do not eat or drink anything (including water) for 2 hours after your procedure, or until your local anesthetic has worn off. You can return to your normal diet the day after the procedure. °· Get help right away if you develop worsening shortness of breath, have chest pain, become light-headed, or cough up more than a small amount of blood. °This information is not intended to replace advice given to you by your health care provider. Make sure you discuss any questions you have with your health care provider. °Document Released: 07/17/2004 Document Revised: 01/16/2016 Document Reviewed: 01/16/2016 °Elsevier Interactive Patient Education © 2017 [employer] ° °

## 2016-04-06 NOTE — H&P (Signed)
[address]       [place] [address]             [phone]                                                   Arif J Oshima McDonough Medical Record #[id number] Date of Birth: 1924-09-26  Referring: No ref. provider found Primary Care: Curlene Labrum, MD  Chief Complaint:        Lung Mass     f/u to review PET Scan and MRI Brain    History of Present Illness:    Tyler Pitts 83 y.o. male is seen in the office   for Abnormal CT of the chest.Patient notes that in February 2018 he had left chest discomfort. A chest x-ray was done and he was treated for pneumonia follow-up CT scan suggested a left hilar mass and the patient was referred to thoracic surgery.  Patient quit smoking in 1975 the day his father was diagnosed with lung cancer   Since I saw the patient several days ago PET scan has been performed and MRI of the brain. The patient comes in today to arrange for biopsy tissue diagnosis. The patient has family notes that they would like to see Dr.Neijstrom if possible.   Current Activity/ Functional Status:  Patient is independent with mobility/ambulation, transfers, ADL's, IADL's.   Zubrod Score: At the time of surgery this patient's most appropriate activity status/level should be described as: '[]'$     0    Normal activity, no symptoms '[x]'$     1    Restricted in physical strenuous activity but ambulatory, able to do out light work '[]'$     2    Ambulatory and capable of self care, unable to do work activities, up and about               >50 % of waking hours                              '[]'$     3    Only limited self care, in bed greater than 50% of waking hours '[]'$     4    Completely disabled, no self care, confined to bed or chair '[]'$     5    Moribund       Past Medical History:  Diagnosis Date  . Arthritis   . Cancer [facility])    recently dx with lung cancer  . GERD (gastroesophageal reflux disease)    'sometimes"  . Hepatic  lesions 03/17/2016   PER CT CHEST  . Hilar adenopathy 03/17/2016   PER CT CHEST   . History of ERCP   . History of kidney stones    about 50 yrs ago  . History of pancreatitis 2001  . HOH (hard of hearing)    bilateral hearing aids  . Hypertension   . Mass of upper lobe of left lung 03/17/2016   PER CT CHEST 03/17/16  . Pneumonia    left sided  . Shortness of breath          Past Surgical History:  Procedure Laterality Date  . CHOLECYSTECTOMY    . COLONOSCOPY  04/02/2011   Procedure: COLONOSCOPY;  Surgeon: Rogene Houston, MD;  Location[facility];  Service: Endoscopy;  Laterality: N/A;  1030  . COLONOSCOPY N/A 05/09/2014   Procedure: COLONOSCOPY;  Surgeon: Rogene Houston, MD;  Location: [facility];  Service: Endoscopy;  Laterality: N/A;  1030  . ERCP    . EYE SURGERY     bilateral cataracts  . Left hip surgery Left 1994   DUE TO FALL.Marland KitchenHAD BACK FX AT THE SAME TIME         Family History  Problem Relation Age of Onset  . Lung cancer Mother   . Lung cancer Father   . Colon cancer Neg Hx     Social History        Social History  . Marital status: Married    Spouse name: N/A  . Number of children: N/A  . Years of education: N/A   Occupational History  . Not on file.        Social History Main Topics  . Smoking status: Former Smoker    Packs/day: 4.00    Years: 25.00  . Smokeless tobacco: Not on file  . Alcohol use No  . Drug use: No  . Sexual activity: Not on file          History  Smoking Status  . Former Smoker  . Packs/day: 4.00  . Years: 25.00  . Quit date: 04/05/1973  Smokeless Tobacco  . Never Used       History  Alcohol Use No          Allergies  Allergen Reactions  . Phenergan [Promethazine Hcl] Other (See Comments)    HYPER, UNABLE TO SLEEP  . Vitamin D Analogs Other (See Comments)    CONSTIPATION             Current Facility-Administered Medications  Medication  Dose Route Frequency Provider Last Rate Last Dose  . 0.9 % irrigation (POUR BTL)    PRN Grace Isaac, MD      . 1 mL EPINEPHrine 1 mg/mL (1:1000) in 0.9% normal saline (250 mL) irrigation    PRN Grace Isaac, MD      . lactated ringers infusion   Intravenous Once Rica Koyanagi, MD          Review of Systems:               Cardiac Review of Systems: Y or N             Chest Pain [  n  ]         Resting SOB [ n  ]      Exertional SOB  Blue.Reese  ]   Pontianus.Latina [  ]             Pedal Edema [   ]        Palpitations [  ]            Syncope  [  ]    Presyncope [   ]             General Review of Systems: [Y] = yes [  ]=no Constitional: recent weight change [  ];  Wt loss over the last 3 months [   ] anorexia [  ]; fatigue [  ]; nausea [  ]; night sweats [  ]; fever [  ]; or chills [  ];          Dental: poor dentition[  ]; Last Dentist visit:  Eye : blurred vision [  ]; diplopia [   ]; vision changes [  ];  Amaurosis fugax[  ]; Resp: cough Blue.Reese  ];  wheezing[n  ];  hemoptysis[ n ]; shortness of breath[ n ]; paroxysmal nocturnal dyspnea[  ]; dyspnea on exertion[  ]; or orthopnea[  ];  GI:  gallstones[  ], vomiting[  ];  dysphagia[  ]; melena[  ];  hematochezia [  ]; heartburn[  ];   Hx of  Colonoscopy[  ]; GU: kidney stones [  ]; hematuria[  ];   dysuria [  ];  nocturia[  ];  history of     obstruction [  ]; urinary frequency [  ]             Skin: rash, swelling[  ];, hair loss[  ];  peripheral edema[  ];  or itching[  ]; Musculosketetal: myalgias[  ];  joint swelling[  ];  joint erythema[  ];  joint pain[  ];  back pain[  ];             Heme/Lymph: bruising[  ];  bleeding[  ];  anemia[  ];  Neuro: TIA[  ];  headaches[  ];  stroke[  ];  vertigo[  ];  seizures[  ];   paresthesias[  ];  difficulty walking[ n ];             Psych:depression[  ]; anxiety[  ];             Endocrine: diabetes[  ];  thyroid dysfunction[  ];             Immunizations: Flu up to date [ y ];  Pneumococcal up to date Blue.Reese  ];             Other:  Physical Exam: BP (!) 163/69   Pulse 72   Temp 98.1 F (36.7 C) (Oral)   Resp 20   SpO2 96%   PHYSICAL EXAMINATION:  Physical Exam  Constitutional: He is oriented to person, place, and time and well-developed, well-nourished, and in no distress.  HENT:  Head: Normocephalic.  Eyes: Pupils are equal, round, and reactive to light.  Neck: Normal range of motion. Neck supple. No JVD present. No tracheal deviation present. No thyromegaly present.  Cardiovascular: Normal rate.  PMI is not displaced.   No murmur heard. Pulmonary/Chest: No stridor. No respiratory distress. He has no wheezes. He has no rales.  Abdominal: He exhibits no distension. There is no tenderness.  Musculoskeletal: Normal range of motion.  Lymphadenopathy:    He has no cervical adenopathy.    He has no axillary adenopathy.       Right: No inguinal and no supraclavicular adenopathy present.       Left: No inguinal and no supraclavicular adenopathy present.  Neurological: He is alert and oriented to person, place, and time.  Skin: Skin is warm and dry.  Psychiatric: Affect normal.     Diagnostic Studies & Laboratory data:     Recent Radiology Findings:   Mr Jeri Cos Wo Contrast  Result Date: 04/01/2016 CLINICAL DATA:  83 year old male with recent diagnosis of hypermetabolic left upper lobe lung mass most compatible with lung cancer. Staging. Subsequent encounter. EXAM: MRI HEAD WITHOUT AND WITH CONTRAST TECHNIQUE: Multiplanar, multiecho pulse sequences of the brain and surrounding structures were obtained without and with intravenous contrast. CONTRAST:  93m MULTIHANCE GADOBENATE DIMEGLUMINE 529 MG/ML IV SOLN COMPARISON:  PET-CT 03/30/2016 and earlier. FINDINGS: Brain:  No midline shift, mass effect, or evidence of intracranial mass lesion. No abnormal enhancement identified. No dural thickening. Cerebral volume is within normal limits for age. No  restricted diffusion to suggest acute infarction. No ventriculomegaly, extra-axial collection or acute intracranial hemorrhage. Cervicomedullary junction and pituitary are within normal limits. Pearline Cables and white matter signal is within normal limits for age throughout the brain. No cortical encephalomalacia or chronic cerebral blood products. Vascular: Major intracranial vascular flow voids are preserved. Skull and upper cervical spine: Negative. Visualized bone marrow signal is within normal limits. Sinuses/Orbits: Postoperative changes to the globes, otherwise normal orbits soft tissues. Visualized paranasal sinuses and mastoids are well pneumatized. Other: Visible internal auditory structures appear normal. Negative scalp soft tissues. IMPRESSION: No metastatic disease or acute intracranial abnormality. Normal for age MRI appearance of the brain. Electronically Signed   By: Genevie Ann M.D.   On: 04/01/2016 11:32   Nm Pet Image Initial (pi) Skull Base To Thigh  Result Date: 03/30/2016 CLINICAL DATA:  Initial treatment strategy for left upper lobe lung mass. EXAM: NUCLEAR MEDICINE PET SKULL BASE TO THIGH TECHNIQUE: Twelve point head mCi F-18 FDG was injected intravenously. Full-ring PET imaging was performed from the skull base to thigh after the radiotracer. CT data was obtained and used for attenuation correction and anatomic localization. FASTING BLOOD GLUCOSE:  Value: 93 mg/dl COMPARISON:  CT chest 03/17/2016 FINDINGS: NECK No hypermetabolic lymph nodes in the neck. CHEST 3.3 by 4.3 cm central left upper lobe mass with maximum SUV 17.9. Adjacent adenopathy anterior to the left pulmonary artery, short axis diameter 1.5 cm, maximum SUV 18.1. Postobstructive atelectasis/ pneumonitis in the left upper lobe. The mass significantly narrows the left superior lobar bronchus. No other hypermetabolic nodules identified. Atherosclerotic aortic arch. ABDOMEN/PELVIS No abnormal hypermetabolic activity within the liver,  pancreas, adrenal glands, or spleen. No hypermetabolic lymph nodes in the abdomen or pelvis. Calcification associated with the right posterior prostatic apex has some faint but measurable hypermetabolic activity with maximum SUV 6.9. This persists on the an corrected images and accordingly I do not feel that it is only due to attenuation correction related to the calcification. Enlarged prostate gland. Aortoiliac atherosclerotic vascular disease. Faint stranding in the mesentery. SKELETON There is a left hip prosthesis noted with accentuated activity in the vicinity of the prosthesis is specially anteriorly, probably mostly due to partially attributable to attenuation correction and partially attributable to inflammation. I am skeptical of metastatic disease to this area. IMPRESSION: 1. 4.3 cm central left upper lobe mass, maximum SUV 17.9, with adjacent adenopathy in the ipsilateral mediastinum, and associated postobstructive atelectasis/ pneumonitis in the left upper lobe. This mass significantly narrows the left superior lobar bronchus. Localized pleural/mediastinal invasion is not readily excluded given the morphology of the mass. If this is non-small cell lung cancer than appearance is compatible with T2a N2 M0 disease (stage IIIA). 2. Faint hypermetabolic activity associated with calcification in the right posterior prostatic apex. The prostate gland is enlarged. Correlate with PSA level in determining need for further workup. 3. Atherosclerosis. 4. Left hip prosthesis, activity in the vicinity of the prosthesis is likely inflammatory. Electronically Signed   By: Van Clines M.D.   On: 03/30/2016 15:37    Ct Chest W Contrast  Result Date: 03/17/2016 CLINICAL DATA:  Abnormal chest x-ray 03/07/2016 EXAM: CT CHEST WITH CONTRAST TECHNIQUE: Multidetector CT imaging of the chest was performed during intravenous contrast administration. CONTRAST:  64m ISOVUE-300 IOPAMIDOL (ISOVUE-300) INJECTION 61%  COMPARISON:  None. FINDINGS: Cardiovascular:  Atherosclerotic calcifications of thoracic aorta. Heart size within normal limits. No pericardial effusion. Mediastinum/Nodes: Tiny hiatal hernia is noted. There is a left upper mediastinal lymph node just anterior to main pulmonary artery measures 1.8 cm highly suspicious for metastatic adenopathy. No right hilar adenopathy is noted. No sub- carinal adenopathy. Trachea and bilateral main bronchus is patent. Axial image 60 there is left upper lobe confluent mass or adenopathy abutting the left anterior mediastinum measures at least 4 cm. This is highly suspicious for mass. Second left anterior mediastinal lymph node measures 1.2 cm suspicious for metastatic adenopathy. Lungs/Pleura: There is partial obstruction of the left upper lobe anteromedial bronchus. There is postobstructive pneumonia or infiltrative tumor spread in left upper lobe anteromedially axial image 62. Mild peripheral pleural enhancement suspicious for pleural involvement. Further evaluation with bronchoscopy and PET scan and surgical thoracic consult is recommended. There is small left posterior pleural effusion. Mild emphysematous changes noted bilateral upper lobes. Mild with left basilar posterior pleural thickening. Upper Abdomen: The visualized upper abdomen shows indeterminate low-density lesion within left hepatic lobe measures 9.5 mm. A there is a low-density lesion in right hepatic dome anteriorly. Metastatic disease cannot be excluded. No adrenal gland mass. Visualized pancreas and spleen is unremarkable. Musculoskeletal: No destructive bony lesions are noted. Sagittal images of the spine show mild degenerative change thoracic spine. Sagittal view of the sternum is unremarkable. No rib lesions are identified. IMPRESSION: 1. There is confluent mass or adenopathy in left upper lobe suprahilar region anteriorly abutting the mediastinum measures at least 4 cm. At least 2 left anterior mediastinal  lymph nodes are noted suspicious for metastatic disease. This is highly suspicious for malignancy. Further correlation with bronchoscopy PET scan and thoracic surgical consult is recommended. There is postobstructive pneumonia or tumor spread in left upper lobe anteromedially. There is mild pleural enhancement. Pleural metastatic disease cannot be excluded. Partial obstruction of the left upper lobe anteromedial bronchus. 2. No sub- carinal or right hilar adenopathy. 3. The right lung is clear. Small left pleural effusion left basilar posterior atelectasis. There is mild pleural thickening in left base posteriorly laterally. 4. Indeterminate low-density lesions within liver the largest measures 9.5 mm in left hepatic lobe. Further evaluation with enhanced CT or MRI is recommended to exclude metastatic disease. No adrenal gland mass. 5. Degenerative changes thoracic spine. No destructive bony lesions are noted. Electronically Signed   By: Lahoma Crocker M.D.   On: 03/17/2016 08:30     I have independently reviewed the above radiologic studies.  Recent Lab Findings: Recent Labs       Lab Results  Component Value Date   WBC 8.0 04/05/2016   HGB 13.9 04/05/2016   HCT 42.3 04/05/2016   PLT 201 04/05/2016   GLUCOSE 105 (H) 04/05/2016   ALT 16 (L) 04/05/2016   AST 17 04/05/2016   NA 142 04/05/2016   K 3.9 04/05/2016   CL 107 04/05/2016   CREATININE 0.76 04/05/2016   BUN 15 04/05/2016   CO2 26 04/05/2016   INR 1.07 04/05/2016        Assessment / Plan:   Suspect advanced Stage  lung cancer left upper lobe with partial lung collapse . This diagnosis has been discussed with the patient and his family in detail.  I suspected a portion of the mass evident on CT is collapsed lung in the best diagnostic approach will be with bronchoscopy and biopsy of the central lesion. Suspect T2a N2 M0 disease (stage IIIA)  I discussed with  the patient and his family proceeding with bronchoscopy  with ebus to obtain a tissue diagnosis.  The goals risks and alternatives of the planned surgical procedure Procedure(s): VIDEO BRONCHOSCOPY WITH ENDOBRONCHIAL ULTRASOUND (N/A)  have been discussed with the patient in detail. The risks of the procedure including death, infection, stroke, myocardial infarction, bleeding, blood transfusion have all been discussed specifically.  I have quoted Tyler Pitts a 1 % of perioperative mortality and a complication rate as high as 20 %. The patient's questions have been answered.Tyler Pitts is willing  to proceed with the planned procedure.   Patient is concerned about urinary issues after surgery, because he developed significant urinary retention after a fall and fractured hip.     Grace Isaac MD      [address] [place] [address] Office [phone]                Beeper [phone]  04/06/2016 2:16 PM

## 2016-04-06 NOTE — Transfer of Care (Signed)
Immediate Anesthesia Transfer of Care Note  Patient: Tyler Pitts  Procedure(s) Performed: Procedure(s): VIDEO BRONCHOSCOPY WITH ENDOBRONCHIAL ULTRASOUND with lung and lymph node biopsy (N/A)  Patient Location: PACU  Anesthesia Type:General  Level of Consciousness: awake and alert   Airway & Oxygen Therapy: Patient Spontanous Breathing and Patient connected to nasal cannula oxygen  Post-op Assessment: Report given to RN and Post -op Vital signs reviewed and stable  Post vital signs: Reviewed and stable  Last Vitals:  Vitals:   04/06/16 1259 04/06/16 1557  BP: (!) 163/69   Pulse: 72   Resp: 20   Temp: 36.7 C (P) 36.4 C    Last Pain:  Vitals:   04/06/16 1259  TempSrc: Oral         Complications: No apparent anesthesia complications

## 2016-04-06 NOTE — Progress Notes (Signed)
[address]       [place] [address]             [phone]                    Tyler Pitts [facility] Medical Record #[id number] Date of Birth: [birth year]  Referring: No ref. provider found Primary Care: Curlene Labrum, MD  Chief Complaint:    Lung Mass     f/u to review PET Scan and MRI Brain    History of Present Illness:    Tyler Pitts 83 y.o. male is seen in the office   for Abnormal CT of the chest.Patient notes that in February 2018 he had left chest discomfort. A chest x-ray was done and he was treated for pneumonia follow-up CT scan suggested a left hilar mass and the patient was referred to thoracic surgery.  Patient quit smoking in 1975 the day his father was diagnosed with lung cancer   Since I saw the patient several days ago PET scan has been performed and MRI of the brain. The patient comes in today to arrange for biopsy tissue diagnosis. The patient has family notes that they would like to see Dr.Neijstrom if possible.   Current Activity/ Functional Status:  Patient is independent with mobility/ambulation, transfers, ADL's, IADL's.   Zubrod Score: At the time of surgery this patient's most appropriate activity status/level should be described as: '[]'$     0    Normal activity, no symptoms '[x]'$     1    Restricted in physical strenuous activity but ambulatory, able to do out light work '[]'$     2    Ambulatory and capable of self care, unable to do work activities, up and about               >50 % of waking hours                              '[]'$     3    Only limited self care, in bed greater than 50% of waking hours '[]'$     4    Completely disabled, no self care, confined to bed or chair '[]'$     5    Moribund   Past Medical History:  Diagnosis Date  . Arthritis   . Cancer [facility])    recently dx with lung cancer  . GERD (gastroesophageal reflux disease)    'sometimes"  . Hepatic lesions 03/17/2016   PER CT CHEST  . Hilar  adenopathy 03/17/2016   PER CT CHEST   . History of ERCP   . History of kidney stones    about 50 yrs ago  . History of pancreatitis 2001  . HOH (hard of hearing)    bilateral hearing aids  . Hypertension   . Mass of upper lobe of left lung 03/17/2016   PER CT CHEST 03/17/16  . Pneumonia    left sided  . Shortness of breath     Past Surgical History:  Procedure Laterality Date  . CHOLECYSTECTOMY    . COLONOSCOPY  04/02/2011   Procedure: COLONOSCOPY;  Surgeon: Rogene Houston, MD;  Location: [facility];  Service: Endoscopy;  Laterality: N/A;  1030  . COLONOSCOPY N/A 05/09/2014   Procedure: COLONOSCOPY;  Surgeon: Rogene Houston, MD;  Location: [facility];  Service: Endoscopy;  Laterality: N/A;  1030  .  ERCP    . EYE SURGERY     bilateral cataracts  . Left hip surgery Left 1994   DUE TO FALL.Marland KitchenHAD BACK FX AT THE SAME TIME    Family History  Problem Relation Age of Onset  . Lung cancer Mother   . Lung cancer Father   . Colon cancer Neg Hx     Social History   Social History  . Marital status: Married    Spouse name: N/A  . Number of children: N/A  . Years of education: N/A   Occupational History  . Not on file.   Social History Main Topics  . Smoking status: Former Smoker    Packs/day: 4.00    Years: 25.00  . Smokeless tobacco: Not on file  . Alcohol use No  . Drug use: No  . Sexual activity: Not on file      History  Smoking Status  . Former Smoker  . Packs/day: 4.00  . Years: 25.00  . Quit date: 04/05/1973  Smokeless Tobacco  . Never Used    History  Alcohol Use No     Allergies  Allergen Reactions  . Phenergan [Promethazine Hcl] Other (See Comments)    HYPER, UNABLE TO SLEEP  . Vitamin D Analogs Other (See Comments)    CONSTIPATION    Current Facility-Administered Medications  Medication Dose Route Frequency Provider Last Rate Last Dose  . 0.9 % irrigation (POUR BTL)    PRN Grace Isaac, MD      . 1 mL EPINEPHrine 1 mg/mL  (1:1000) in 0.9% normal saline (250 mL) irrigation    PRN Grace Isaac, MD      . lactated ringers infusion   Intravenous Once Rica Koyanagi, MD          Review of Systems:     Cardiac Review of Systems: Y or N  Chest Pain [  n  ]  Resting SOB [ n  ] Exertional SOB  Blue.Reese  ]  Pontianus.Latina [  ]   Pedal Edema [   ]    Palpitations [  ] Syncope  [  ]   Presyncope [   ]  General Review of Systems: [Y] = yes [  ]=no Constitional: recent weight change [  ];  Wt loss over the last 3 months [   ] anorexia [  ]; fatigue [  ]; nausea [  ]; night sweats [  ]; fever [  ]; or chills [  ];          Dental: poor dentition[  ]; Last Dentist visit:   Eye : blurred vision [  ]; diplopia [   ]; vision changes [  ];  Amaurosis fugax[  ]; Resp: cough Blue.Reese  ];  wheezing[n  ];  hemoptysis[ n ]; shortness of breath[ n ]; paroxysmal nocturnal dyspnea[  ]; dyspnea on exertion[  ]; or orthopnea[  ];  GI:  gallstones[  ], vomiting[  ];  dysphagia[  ]; melena[  ];  hematochezia [  ]; heartburn[  ];   Hx of  Colonoscopy[  ]; GU: kidney stones [  ]; hematuria[  ];   dysuria [  ];  nocturia[  ];  history of     obstruction [  ]; urinary frequency [  ]             Skin: rash, swelling[  ];, hair loss[  ];  peripheral edema[  ];  or itching[  ];  Musculosketetal: myalgias[  ];  joint swelling[  ];  joint erythema[  ];  joint pain[  ];  back pain[  ];  Heme/Lymph: bruising[  ];  bleeding[  ];  anemia[  ];  Neuro: TIA[  ];  headaches[  ];  stroke[  ];  vertigo[  ];  seizures[  ];   paresthesias[  ];  difficulty walking[ n ];  Psych:depression[  ]; anxiety[  ];  Endocrine: diabetes[  ];  thyroid dysfunction[  ];  Immunizations: Flu up to date [ y ]; Pneumococcal up to date Blue.Reese  ];  Other:  Physical Exam: BP (!) 163/69   Pulse 72   Temp 98.1 F (36.7 C) (Oral)   Resp 20   SpO2 96%   PHYSICAL EXAMINATION:  Physical Exam  Constitutional: He is oriented to person, place, and time and well-developed, well-nourished, and in  no distress.  HENT:  Head: Normocephalic.  Eyes: Pupils are equal, round, and reactive to light.  Neck: Normal range of motion. Neck supple. No JVD present. No tracheal deviation present. No thyromegaly present.  Cardiovascular: Normal rate.  PMI is not displaced.   No murmur heard. Pulmonary/Chest: No stridor. No respiratory distress. He has no wheezes. He has no rales.  Abdominal: He exhibits no distension. There is no tenderness.  Musculoskeletal: Normal range of motion.  Lymphadenopathy:    He has no cervical adenopathy.    He has no axillary adenopathy.       Right: No inguinal and no supraclavicular adenopathy present.       Left: No inguinal and no supraclavicular adenopathy present.  Neurological: He is alert and oriented to person, place, and time.  Skin: Skin is warm and dry.  Psychiatric: Affect normal.     Diagnostic Studies & Laboratory data:     Recent Radiology Findings:   Mr Jeri Cos Wo Contrast  Result Date: 04/01/2016 CLINICAL DATA:  83 year old male with recent diagnosis of hypermetabolic left upper lobe lung mass most compatible with lung cancer. Staging. Subsequent encounter. EXAM: MRI HEAD WITHOUT AND WITH CONTRAST TECHNIQUE: Multiplanar, multiecho pulse sequences of the brain and surrounding structures were obtained without and with intravenous contrast. CONTRAST:  2m MULTIHANCE GADOBENATE DIMEGLUMINE 529 MG/ML IV SOLN COMPARISON:  PET-CT 03/30/2016 and earlier. FINDINGS: Brain: No midline shift, mass effect, or evidence of intracranial mass lesion. No abnormal enhancement identified. No dural thickening. Cerebral volume is within normal limits for age. No restricted diffusion to suggest acute infarction. No ventriculomegaly, extra-axial collection or acute intracranial hemorrhage. Cervicomedullary junction and pituitary are within normal limits. GPearline Cablesand white matter signal is within normal limits for age throughout the brain. No cortical encephalomalacia or  chronic cerebral blood products. Vascular: Major intracranial vascular flow voids are preserved. Skull and upper cervical spine: Negative. Visualized bone marrow signal is within normal limits. Sinuses/Orbits: Postoperative changes to the globes, otherwise normal orbits soft tissues. Visualized paranasal sinuses and mastoids are well pneumatized. Other: Visible internal auditory structures appear normal. Negative scalp soft tissues. IMPRESSION: No metastatic disease or acute intracranial abnormality. Normal for age MRI appearance of the brain. Electronically Signed   By: HGenevie AnnM.D.   On: 04/01/2016 11:32   Nm Pet Image Initial (pi) Skull Base To Thigh  Result Date: 03/30/2016 CLINICAL DATA:  Initial treatment strategy for left upper lobe lung mass. EXAM: NUCLEAR MEDICINE PET SKULL BASE TO THIGH TECHNIQUE: Twelve point head mCi F-18 FDG was injected intravenously. Full-ring PET imaging was performed from the skull  base to thigh after the radiotracer. CT data was obtained and used for attenuation correction and anatomic localization. FASTING BLOOD GLUCOSE:  Value: 93 mg/dl COMPARISON:  CT chest 03/17/2016 FINDINGS: NECK No hypermetabolic lymph nodes in the neck. CHEST 3.3 by 4.3 cm central left upper lobe mass with maximum SUV 17.9. Adjacent adenopathy anterior to the left pulmonary artery, short axis diameter 1.5 cm, maximum SUV 18.1. Postobstructive atelectasis/ pneumonitis in the left upper lobe. The mass significantly narrows the left superior lobar bronchus. No other hypermetabolic nodules identified. Atherosclerotic aortic arch. ABDOMEN/PELVIS No abnormal hypermetabolic activity within the liver, pancreas, adrenal glands, or spleen. No hypermetabolic lymph nodes in the abdomen or pelvis. Calcification associated with the right posterior prostatic apex has some faint but measurable hypermetabolic activity with maximum SUV 6.9. This persists on the an corrected images and accordingly I do not feel that it is  only due to attenuation correction related to the calcification. Enlarged prostate gland. Aortoiliac atherosclerotic vascular disease. Faint stranding in the mesentery. SKELETON There is a left hip prosthesis noted with accentuated activity in the vicinity of the prosthesis is specially anteriorly, probably mostly due to partially attributable to attenuation correction and partially attributable to inflammation. I am skeptical of metastatic disease to this area. IMPRESSION: 1. 4.3 cm central left upper lobe mass, maximum SUV 17.9, with adjacent adenopathy in the ipsilateral mediastinum, and associated postobstructive atelectasis/ pneumonitis in the left upper lobe. This mass significantly narrows the left superior lobar bronchus. Localized pleural/mediastinal invasion is not readily excluded given the morphology of the mass. If this is non-small cell lung cancer than appearance is compatible with T2a N2 M0 disease (stage IIIA). 2. Faint hypermetabolic activity associated with calcification in the right posterior prostatic apex. The prostate gland is enlarged. Correlate with PSA level in determining need for further workup. 3. Atherosclerosis. 4. Left hip prosthesis, activity in the vicinity of the prosthesis is likely inflammatory. Electronically Signed   By: Van Clines M.D.   On: 03/30/2016 15:37    Ct Chest W Contrast  Result Date: 03/17/2016 CLINICAL DATA:  Abnormal chest x-ray 03/07/2016 EXAM: CT CHEST WITH CONTRAST TECHNIQUE: Multidetector CT imaging of the chest was performed during intravenous contrast administration. CONTRAST:  9m ISOVUE-300 IOPAMIDOL (ISOVUE-300) INJECTION 61% COMPARISON:  None. FINDINGS: Cardiovascular: Atherosclerotic calcifications of thoracic aorta. Heart size within normal limits. No pericardial effusion. Mediastinum/Nodes: Tiny hiatal hernia is noted. There is a left upper mediastinal lymph node just anterior to main pulmonary artery measures 1.8 cm highly suspicious for  metastatic adenopathy. No right hilar adenopathy is noted. No sub- carinal adenopathy. Trachea and bilateral main bronchus is patent. Axial image 60 there is left upper lobe confluent mass or adenopathy abutting the left anterior mediastinum measures at least 4 cm. This is highly suspicious for mass. Second left anterior mediastinal lymph node measures 1.2 cm suspicious for metastatic adenopathy. Lungs/Pleura: There is partial obstruction of the left upper lobe anteromedial bronchus. There is postobstructive pneumonia or infiltrative tumor spread in left upper lobe anteromedially axial image 62. Mild peripheral pleural enhancement suspicious for pleural involvement. Further evaluation with bronchoscopy and PET scan and surgical thoracic consult is recommended. There is small left posterior pleural effusion. Mild emphysematous changes noted bilateral upper lobes. Mild with left basilar posterior pleural thickening. Upper Abdomen: The visualized upper abdomen shows indeterminate low-density lesion within left hepatic lobe measures 9.5 mm. A there is a low-density lesion in right hepatic dome anteriorly. Metastatic disease cannot be excluded. No adrenal gland mass. Visualized  pancreas and spleen is unremarkable. Musculoskeletal: No destructive bony lesions are noted. Sagittal images of the spine show mild degenerative change thoracic spine. Sagittal view of the sternum is unremarkable. No rib lesions are identified. IMPRESSION: 1. There is confluent mass or adenopathy in left upper lobe suprahilar region anteriorly abutting the mediastinum measures at least 4 cm. At least 2 left anterior mediastinal lymph nodes are noted suspicious for metastatic disease. This is highly suspicious for malignancy. Further correlation with bronchoscopy PET scan and thoracic surgical consult is recommended. There is postobstructive pneumonia or tumor spread in left upper lobe anteromedially. There is mild pleural enhancement. Pleural  metastatic disease cannot be excluded. Partial obstruction of the left upper lobe anteromedial bronchus. 2. No sub- carinal or right hilar adenopathy. 3. The right lung is clear. Small left pleural effusion left basilar posterior atelectasis. There is mild pleural thickening in left base posteriorly laterally. 4. Indeterminate low-density lesions within liver the largest measures 9.5 mm in left hepatic lobe. Further evaluation with enhanced CT or MRI is recommended to exclude metastatic disease. No adrenal gland mass. 5. Degenerative changes thoracic spine. No destructive bony lesions are noted. Electronically Signed   By: Lahoma Crocker M.D.   On: 03/17/2016 08:30     I have independently reviewed the above radiologic studies.  Recent Lab Findings: Lab Results  Component Value Date   WBC 8.0 04/05/2016   HGB 13.9 04/05/2016   HCT 42.3 04/05/2016   PLT 201 04/05/2016   GLUCOSE 105 (H) 04/05/2016   ALT 16 (L) 04/05/2016   AST 17 04/05/2016   NA 142 04/05/2016   K 3.9 04/05/2016   CL 107 04/05/2016   CREATININE 0.76 04/05/2016   BUN 15 04/05/2016   CO2 26 04/05/2016   INR 1.07 04/05/2016      Assessment / Plan:   Suspect advanced Stage  lung cancer left upper lobe with partial lung collapse . This diagnosis has been discussed with the patient and his family in detail.  I suspected a portion of the mass evident on CT is collapsed lung in the best diagnostic approach will be with bronchoscopy and biopsy of the central lesion. Suspect T2a N2 M0 disease (stage IIIA)  I discussed with the patient and his family proceeding with bronchoscopy with ebus to obtain a tissue diagnosis.  The goals risks and alternatives of the planned surgical procedure Procedure(s): VIDEO BRONCHOSCOPY WITH ENDOBRONCHIAL ULTRASOUND (N/A)  have been discussed with the patient in detail. The risks of the procedure including death, infection, stroke, myocardial infarction, bleeding, blood transfusion have all been  discussed specifically.  I have quoted Erlinda Hong a 1 % of perioperative mortality and a complication rate as high as 20 %. The patient's questions have been answered.Tyler Pitts is willing  to proceed with the planned procedure.   Patient is concerned about urinary issues after surgery, because he developed significant urinary retention after a fall and fractured hip.     Grace Isaac MD      [address] [place] [address] Office [phone]   Beeper [phone]  04/06/2016 2:16 PM

## 2016-04-06 NOTE — Anesthesia Procedure Notes (Signed)
Procedure Name: Intubation Date/Time: 04/06/2016 2:53 PM Performed by: Rica Koyanagi Pre-anesthesia Checklist: Patient identified, Patient being monitored, Timeout performed, Emergency Drugs available and Suction available Patient Re-evaluated:Patient Re-evaluated prior to inductionPreoxygenation: Pre-oxygenation with 100% oxygen Intubation Type: IV induction Ventilation: Oral airway inserted - appropriate to patient size Laryngoscope Size: Mac and 4 Grade View: Grade I Tube type: Oral Tube size: 8.5 mm Number of attempts: 1 Airway Equipment and Method: Stylet Placement Confirmation: ETT inserted through vocal cords under direct vision,  positive ETCO2 and breath sounds checked- equal and bilateral Dental Injury: Teeth and Oropharynx as per pre-operative assessment  Comments: E. Tresohlavy,SRNA

## 2016-04-07 ENCOUNTER — Encounter (HOSPITAL_COMMUNITY): Payer: Self-pay | Admitting: Cardiothoracic Surgery

## 2016-04-07 NOTE — Anesthesia Postprocedure Evaluation (Addendum)
Anesthesia Post Note  Patient: CASEN PRYOR  Procedure(s) Performed: Procedure(s) (LRB): VIDEO BRONCHOSCOPY WITH ENDOBRONCHIAL ULTRASOUND with lung and lymph node biopsy (N/A)  Patient location during evaluation: PACU Anesthesia Type: General Level of consciousness: awake and alert Pain management: pain level controlled Vital Signs Assessment: post-procedure vital signs reviewed and stable Respiratory status: spontaneous breathing, nonlabored ventilation, respiratory function stable and patient connected to nasal cannula oxygen Cardiovascular status: blood pressure returned to baseline and stable Postop Assessment: no signs of nausea or vomiting Anesthetic complications: no       Last Vitals:  Vitals:   04/06/16 1730 04/06/16 1734  BP: 93/79 (!) 155/77  Pulse: 69 70  Resp: 13   Temp:      Last Pain:  Vitals:   04/06/16 1259  TempSrc: Oral                 Tamikia Chowning,JAMES TERRILL

## 2016-04-08 ENCOUNTER — Encounter: Payer: Self-pay | Admitting: *Deleted

## 2016-04-08 ENCOUNTER — Ambulatory Visit: Payer: Medicare HMO | Admitting: Cardiothoracic Surgery

## 2016-04-08 NOTE — Op Note (Signed)
NAME:  Tyler Pitts, Tyler Pitts NO.:  MEDICAL RECORD NO.:  [id number]  LOCATION:                                 FACILITY:  PHYSICIAN:  Lanelle Bal, MD         DATE OF BIRTH:  DATE OF PROCEDURE:  04/06/2016 DATE OF DISCHARGE:                              OPERATIVE REPORT   PREOPERATIVE DIAGNOSIS:  Left lung and hilar mass.  POSTOPERATIVE DIAGNOSIS:  Left lung and hilar mass.  SURGICAL PROCEDURE:  Bronchoscopy with biopsies and brushing and EBUS with biopsy of 10L lymph nodes.  SURGEON:  Lanelle Bal, MD  BRIEF HISTORY:  The patient is an 83 year old male, who presented with cough and abnormal chest x-ray.  CT scan and PET scan were performed that suggested stage IIIA carcinoma of the lung.  The patient is a former smoker, but has not smoked since the early 70s.  Bronchoscopy with biopsy was recommended to the patient to obtain a tissue diagnosis. He agreed and signed informed consent.  DESCRIPTION OF PROCEDURE:  The patient underwent general endotracheal anesthesia without incident.  An appropriate time-out was performed.  We proceeded with bronchoscopy exam with a 2.8-mm fiberoptic bronchoscope. The right tracheobronchial tree was examined first, there was no evidence of endobronchial lesions.  We then examined the left tracheobronchial tree.  The left mainstem, left upper lobe bronchus, the superior segmental.  In this area, there was some friable-appearing tissue and slight narrowing of the bronchus, but no occlusion. Brushings were obtained in this area.  We then proceeded with the EBUS scope and passed the EBUS scope into the left mainstem bronchus and identified 10L lymph nodes.  Transbronchial biopsies were obtained in this area.  Several passes were obtained and smears made, submitted to Pathology.  We then went back into the tracheobronchial tree with the standard bronchoscope and biopsies with a small biopsy forces were obtained in the  takeoff of the left upper lobe anterior segment. Initial cytologic examination, both of the brushings and needle aspirate confirmed probable non-small cell lung cancer.  The remainder of the tissue was sent for permanent satellite and also histology with the biopsy that were done.  The blood loss was minimal.  The patient tolerated the procedure without obvious complication.  He was extubated in the operating room and transferred to the recovery room for further postoperative care.    Lanelle Bal, MD    EG/MEDQ  D:  04/07/2016  T:  04/07/2016  Job:  [id number]  cc:   Gaston Islam. Tressie Stalker, MD

## 2016-04-08 NOTE — Progress Notes (Signed)
Oncology Nurse Navigator Documentation  Oncology Nurse Navigator Flowsheets 04/08/2016  Navigator Encounter Type Other/per cancer conference discussion, I requested PDL1 to be tested on tissue obtained on 04/06/16.    Treatment Phase Pre-Tx/Tx Discussion  Barriers/Navigation Needs Coordination of Care  Interventions Coordination of Care  Coordination of Care Other  Acuity Level 2  Time Spent with Patient 15

## 2016-04-13 ENCOUNTER — Encounter (HOSPITAL_COMMUNITY): Payer: Self-pay

## 2016-04-13 DIAGNOSIS — Z7722 Contact with and (suspected) exposure to environmental tobacco smoke (acute) (chronic): Secondary | ICD-10-CM | POA: Diagnosis not present

## 2016-04-13 DIAGNOSIS — I10 Essential (primary) hypertension: Secondary | ICD-10-CM | POA: Diagnosis not present

## 2016-04-13 DIAGNOSIS — C3412 Malignant neoplasm of upper lobe, left bronchus or lung: Secondary | ICD-10-CM | POA: Diagnosis not present

## 2016-04-13 DIAGNOSIS — Z801 Family history of malignant neoplasm of trachea, bronchus and lung: Secondary | ICD-10-CM | POA: Diagnosis not present

## 2016-04-13 DIAGNOSIS — J90 Pleural effusion, not elsewhere classified: Secondary | ICD-10-CM | POA: Diagnosis not present

## 2016-04-13 DIAGNOSIS — Z87891 Personal history of nicotine dependence: Secondary | ICD-10-CM | POA: Diagnosis not present

## 2016-04-13 DIAGNOSIS — Z5111 Encounter for antineoplastic chemotherapy: Secondary | ICD-10-CM | POA: Diagnosis not present

## 2016-04-14 ENCOUNTER — Other Ambulatory Visit: Payer: Self-pay | Admitting: *Deleted

## 2016-04-14 DIAGNOSIS — IMO0002 Reserved for concepts with insufficient information to code with codable children: Secondary | ICD-10-CM

## 2016-04-19 ENCOUNTER — Encounter (HOSPITAL_COMMUNITY): Payer: Self-pay | Admitting: *Deleted

## 2016-04-19 DIAGNOSIS — Z87891 Personal history of nicotine dependence: Secondary | ICD-10-CM | POA: Diagnosis not present

## 2016-04-19 DIAGNOSIS — C3412 Malignant neoplasm of upper lobe, left bronchus or lung: Secondary | ICD-10-CM | POA: Diagnosis not present

## 2016-04-19 NOTE — Progress Notes (Signed)
Pt denies any acute cardiopulmonary issues. Pt denies being under the care of a cardiologist. Pt denies having a stress test, echo and cardiac cath. Pt made aware to stop taking Aspirin,vitamins, fish oil and herbal medications. Do not take any NSAIDs ie: Ibuprofen, Advil, Naproxen, BC and Goody Powder or any medication containing Aspirin. Pt verbalized understanding of all pre-op instructions.

## 2016-04-20 ENCOUNTER — Ambulatory Visit (HOSPITAL_COMMUNITY): Payer: Medicare HMO

## 2016-04-20 ENCOUNTER — Encounter (HOSPITAL_COMMUNITY)
Admission: RE | Disposition: A | Discharge status: Home / Self Care | Payer: Self-pay | Source: Ambulatory Visit | Attending: Cardiothoracic Surgery

## 2016-04-20 ENCOUNTER — Ambulatory Visit (HOSPITAL_COMMUNITY)
Admission: RE | Admit: 2016-04-20 | Discharge: 2016-04-20 | Disposition: A | Discharge status: Home / Self Care | Payer: Medicare HMO | Source: Ambulatory Visit | Attending: Cardiothoracic Surgery | Admitting: Cardiothoracic Surgery

## 2016-04-20 ENCOUNTER — Encounter (HOSPITAL_COMMUNITY): Payer: Self-pay | Admitting: Anesthesiology

## 2016-04-20 ENCOUNTER — Ambulatory Visit (HOSPITAL_COMMUNITY): Payer: Medicare HMO | Admitting: Anesthesiology

## 2016-04-20 DIAGNOSIS — Z419 Encounter for procedure for purposes other than remedying health state, unspecified: Secondary | ICD-10-CM

## 2016-04-20 DIAGNOSIS — Z87891 Personal history of nicotine dependence: Secondary | ICD-10-CM | POA: Insufficient documentation

## 2016-04-20 DIAGNOSIS — I10 Essential (primary) hypertension: Secondary | ICD-10-CM | POA: Insufficient documentation

## 2016-04-20 DIAGNOSIS — Z801 Family history of malignant neoplasm of trachea, bronchus and lung: Secondary | ICD-10-CM | POA: Diagnosis not present

## 2016-04-20 DIAGNOSIS — IMO0002 Reserved for concepts with insufficient information to code with codable children: Secondary | ICD-10-CM

## 2016-04-20 DIAGNOSIS — K219 Gastro-esophageal reflux disease without esophagitis: Secondary | ICD-10-CM | POA: Diagnosis not present

## 2016-04-20 DIAGNOSIS — Z452 Encounter for adjustment and management of vascular access device: Secondary | ICD-10-CM | POA: Diagnosis not present

## 2016-04-20 DIAGNOSIS — C3412 Malignant neoplasm of upper lobe, left bronchus or lung: Secondary | ICD-10-CM | POA: Insufficient documentation

## 2016-04-20 DIAGNOSIS — R590 Localized enlarged lymph nodes: Secondary | ICD-10-CM | POA: Diagnosis not present

## 2016-04-20 DIAGNOSIS — R918 Other nonspecific abnormal finding of lung field: Secondary | ICD-10-CM | POA: Diagnosis not present

## 2016-04-20 DIAGNOSIS — C3490 Malignant neoplasm of unspecified part of unspecified bronchus or lung: Secondary | ICD-10-CM | POA: Diagnosis not present

## 2016-04-20 HISTORY — DX: Other complications of anesthesia, initial encounter: T88.59XA

## 2016-04-20 HISTORY — PX: PORTACATH PLACEMENT: SHX2246

## 2016-04-20 HISTORY — DX: Adverse effect of unspecified anesthetic, initial encounter: T41.45XA

## 2016-04-20 LAB — CBC
HCT: 43 % (ref 39.0–52.0)
Hemoglobin: 13.9 g/dL (ref 13.0–17.0)
MCH: 27.7 pg (ref 26.0–34.0)
MCHC: 32.3 g/dL (ref 30.0–36.0)
MCV: 85.8 fL (ref 78.0–100.0)
Platelets: 242 10*3/uL (ref 150–400)
RBC: 5.01 MIL/uL (ref 4.22–5.81)
RDW: 15.2 % (ref 11.5–15.5)
WBC: 6.6 10*3/uL (ref 4.0–10.5)

## 2016-04-20 LAB — COMPREHENSIVE METABOLIC PANEL
ALT: 10 U/L — ABNORMAL LOW (ref 17–63)
AST: 25 U/L (ref 15–41)
Albumin: 3.7 g/dL (ref 3.5–5.0)
Alkaline Phosphatase: 79 U/L (ref 38–126)
Anion gap: 7 (ref 5–15)
BUN: 20 mg/dL (ref 6–20)
CO2: 27 mmol/L (ref 22–32)
Calcium: 8.9 mg/dL (ref 8.9–10.3)
Chloride: 107 mmol/L (ref 101–111)
Creatinine, Ser: 0.72 mg/dL (ref 0.61–1.24)
GFR calc Af Amer: >60 mL/min (ref >60)
GFR calc non Af Amer: >60 mL/min (ref >60)
Glucose, Bld: 105 mg/dL — ABNORMAL HIGH (ref 65–99)
Potassium: 5.2 mmol/L — ABNORMAL HIGH (ref 3.5–5.1)
Sodium: 141 mmol/L (ref 135–145)
Total Bilirubin: 2 mg/dL — ABNORMAL HIGH (ref 0.3–1.2)
Total Protein: 7.1 g/dL (ref 6.5–8.1)

## 2016-04-20 SURGERY — INSERTION, TUNNELED CENTRAL VENOUS DEVICE, WITH PORT
Anesthesia: Monitor Anesthesia Care | Site: Chest | Laterality: Left

## 2016-04-20 MED ORDER — PROPOFOL 10 MG/ML IV BOLUS
INTRAVENOUS | Status: AC
  Filled 2016-04-20: qty 20

## 2016-04-20 MED ORDER — LACTATED RINGERS IV SOLN: INTRAVENOUS | Status: DC

## 2016-04-20 MED ORDER — SUGAMMADEX SODIUM 200 MG/2ML IV SOLN
INTRAVENOUS | Status: AC
  Filled 2016-04-20: qty 2

## 2016-04-20 MED ORDER — DEXTROSE 5 % IV SOLN
1.5000 g | INTRAVENOUS | Status: AC
  Administered 2016-04-20: 1.5 g via INTRAVENOUS

## 2016-04-20 MED ORDER — LACTATED RINGERS IV SOLN
INTRAVENOUS | Status: DC | PRN
  Administered 2016-04-20: 11:00:00 via INTRAVENOUS

## 2016-04-20 MED ORDER — HEPARIN SODIUM (PORCINE) 1000 UNIT/ML IJ SOLN
INTRAMUSCULAR | Status: DC | PRN
  Administered 2016-04-20: 10 mL via INTRAVENOUS

## 2016-04-20 MED ORDER — PHENYLEPHRINE HCL 10 MG/ML IJ SOLN
INTRAVENOUS | Status: DC | PRN
  Administered 2016-04-20: 25 ug/min via INTRAVENOUS

## 2016-04-20 MED ORDER — SODIUM CHLORIDE 0.9 % IR SOLN
Status: DC | PRN
  Administered 2016-04-20: 1000 mL

## 2016-04-20 MED ORDER — PROPOFOL 10 MG/ML IV BOLUS
INTRAVENOUS | Status: DC | PRN
  Administered 2016-04-20: 20 mg via INTRAVENOUS

## 2016-04-20 MED ORDER — ONDANSETRON HCL 4 MG/2ML IJ SOLN
INTRAMUSCULAR | Status: AC
  Filled 2016-04-20: qty 2

## 2016-04-20 MED ORDER — LIDOCAINE HCL (PF) 1 % IJ SOLN
INTRAMUSCULAR | Status: AC
  Filled 2016-04-20: qty 30

## 2016-04-20 MED ORDER — CEFUROXIME SODIUM 1.5 G IJ SOLR
INTRAMUSCULAR | Status: AC
  Filled 2016-04-20: qty 1.5

## 2016-04-20 MED ORDER — SODIUM CHLORIDE 0.9 % IV SOLN
INTRAVENOUS | Status: DC | PRN
  Administered 2016-04-20: 500 mL

## 2016-04-20 MED ORDER — FENTANYL CITRATE (PF) 250 MCG/5ML IJ SOLN
INTRAMUSCULAR | Status: AC
  Filled 2016-04-20: qty 5

## 2016-04-20 MED ORDER — FENTANYL CITRATE (PF) 100 MCG/2ML IJ SOLN
INTRAMUSCULAR | Status: DC | PRN
  Administered 2016-04-20 (×2): 25 ug via INTRAVENOUS

## 2016-04-20 MED ORDER — TRAMADOL HCL 50 MG PO TABS: 50.0000 mg | ORAL_TABLET | Freq: Four times a day (QID) | ORAL | 0 refills | Status: AC | PRN

## 2016-04-20 MED ORDER — LIDOCAINE HCL (CARDIAC) 20 MG/ML IV SOLN
INTRAVENOUS | Status: DC | PRN
  Administered 2016-04-20: 20 mg via INTRAVENOUS

## 2016-04-20 MED ORDER — EPHEDRINE 5 MG/ML INJ
INTRAVENOUS | Status: AC
  Filled 2016-04-20: qty 10

## 2016-04-20 MED ORDER — LIDOCAINE 2% (20 MG/ML) 5 ML SYRINGE
INTRAMUSCULAR | Status: AC
  Filled 2016-04-20: qty 10

## 2016-04-20 MED ORDER — LIDOCAINE HCL (PF) 1 % IJ SOLN
INTRAMUSCULAR | Status: DC | PRN
  Administered 2016-04-20: 30 mL

## 2016-04-20 MED ORDER — FENTANYL CITRATE (PF) 100 MCG/2ML IJ SOLN: 25.0000 ug | INTRAMUSCULAR | Status: DC | PRN

## 2016-04-20 MED ORDER — HEPARIN SODIUM (PORCINE) 1000 UNIT/ML IJ SOLN
INTRAMUSCULAR | Status: AC
  Filled 2016-04-20: qty 1

## 2016-04-20 SURGICAL SUPPLY — 40 items
ADH SKN CLS APL DERMABOND .7 (GAUZE/BANDAGES/DRESSINGS) ×1
BAG DECANTER FOR FLEXI CONT (MISCELLANEOUS) ×2 IMPLANT
BLADE SURG 11 STRL SS (BLADE) ×3 IMPLANT
CANISTER SUCT 3000ML PPV (MISCELLANEOUS) ×2 IMPLANT
COVER PROBE W GEL 5X96 (DRAPES) ×3 IMPLANT
COVER SURGICAL LIGHT HANDLE (MISCELLANEOUS) ×2 IMPLANT
DERMABOND ADVANCED (GAUZE/BANDAGES/DRESSINGS) ×1
DERMABOND ADVANCED .7 DNX12 (GAUZE/BANDAGES/DRESSINGS) ×1 IMPLANT
DRAPE C-ARM 42X72 X-RAY (DRAPES) ×2 IMPLANT
DRAPE CHEST BREAST 15X10 FENES (DRAPES) ×2 IMPLANT
ELECT CAUTERY BLADE 6.4 (BLADE) ×3 IMPLANT
ELECT REM PT RETURN 9FT ADLT (ELECTROSURGICAL) ×2
ELECTRODE REM PT RTRN 9FT ADLT (ELECTROSURGICAL) ×1 IMPLANT
GAUZE SPONGE 4X4 12PLY STRL (GAUZE/BANDAGES/DRESSINGS) ×2 IMPLANT
GLOVE BIO SURGEON STRL SZ 6.5 (GLOVE) ×8 IMPLANT
GOWN STRL REUS W/ TWL LRG LVL3 (GOWN DISPOSABLE) ×2 IMPLANT
GOWN STRL REUS W/TWL LRG LVL3 (GOWN DISPOSABLE) ×4
GUIDEWIRE UNCOATED ST S 7038 (WIRE) IMPLANT
INTRODUCER 13FR (MISCELLANEOUS) IMPLANT
INTRODUCER COOK 11FR (CATHETERS) IMPLANT
KIT BASIN OR (CUSTOM PROCEDURE TRAY) ×2 IMPLANT
KIT PORT POWER 9.6FR MRI PREA (Catheter) ×1 IMPLANT
KIT ROOM TURNOVER OR (KITS) ×2 IMPLANT
NDL HYPO 25GX1X1/2 BEV (NEEDLE) ×1 IMPLANT
NEEDLE 22X1 1/2 (OR ONLY) (NEEDLE) IMPLANT
NEEDLE HYPO 25GX1X1/2 BEV (NEEDLE) ×2 IMPLANT
NS IRRIG 1000ML POUR BTL (IV SOLUTION) ×2 IMPLANT
PACK GENERAL/GYN (CUSTOM PROCEDURE TRAY) ×2 IMPLANT
PAD ARMBOARD 7.5X6 YLW CONV (MISCELLANEOUS) ×4 IMPLANT
SET SHEATH INTRODUCER 10FR (MISCELLANEOUS) IMPLANT
SUT SILK 2 0 SH (SUTURE) ×3 IMPLANT
SUT VIC AB 3-0 SH 18 (SUTURE) ×3 IMPLANT
SUT VICRYL 4-0 PS2 18IN ABS (SUTURE) ×2 IMPLANT
SYR 20CC LL (SYRINGE) ×2 IMPLANT
SYR 5ML LUER SLIP (SYRINGE) IMPLANT
SYR CONTROL 10ML LL (SYRINGE) ×2 IMPLANT
SYRINGE 20CC LL (MISCELLANEOUS) ×1 IMPLANT
TOWEL OR 17X24 6PK STRL BLUE (TOWEL DISPOSABLE) ×2 IMPLANT
TOWEL OR 17X26 10 PK STRL BLUE (TOWEL DISPOSABLE) ×2 IMPLANT
WATER STERILE IRR 1000ML POUR (IV SOLUTION) ×2 IMPLANT

## 2016-04-20 NOTE — Transfer of Care (Signed)
Immediate Anesthesia Transfer of Care Note  Patient: Tyler Pitts  Procedure(s) Performed: Procedure(s): INSERTION PORT-A-CATH (Left)  Patient Location: PACU  Anesthesia Type:MAC  Level of Consciousness: awake, alert  and oriented  Airway & Oxygen Therapy: Patient Spontanous Breathing  Post-op Assessment: Report given to RN, Post -op Vital signs reviewed and stable and Patient moving all extremities X 4  Post vital signs: Reviewed and stable  Last Vitals:  Vitals:   04/20/16 1022  BP: (!) 180/79  Pulse: 100  Resp: 18  Temp: 36.6 C    Last Pain:  Vitals:   04/20/16 1022  TempSrc: Oral      Patients Stated Pain Goal: 6 (22/48/25 0037)  Complications: No apparent anesthesia complications

## 2016-04-20 NOTE — Anesthesia Procedure Notes (Signed)
Procedure Name: MAC Date/Time: 04/20/2016 11:38 AM Performed by: Neldon Newport Pre-anesthesia Checklist: Timeout performed, Patient being monitored, Suction available, Emergency Drugs available and Patient identified Patient Re-evaluated:Patient Re-evaluated prior to inductionOxygen Delivery Method: Simple face mask Placement Confirmation: positive ETCO2

## 2016-04-20 NOTE — Discharge Instructions (Signed)
Implanted Port Insertion, Care After °This sheet gives you information about how to care for yourself after your procedure. Your health care provider may also give you more specific instructions. If you have problems or questions, contact your health care provider. °What can I expect after the procedure? °After your procedure, it is common to have: °· Discomfort at the port insertion site. °· Bruising on the skin over the port. This should improve over 3-4 days. ° °Follow these instructions at home: °Port care °· After your port is placed, you will get a manufacturer's information card. The card has information about your port. Keep this card with you at all times. °· Take care of the port as told by your health care provider. Ask your health care provider if you or a family member can get training for taking care of the port at home. A home health care nurse may also take care of the port. °· Make sure to remember what type of port you have. °Incision care °· Follow instructions from your health care provider about how to take care of your port insertion site. Make sure you: °? Wash your hands with soap and water before you change your bandage (dressing). If soap and water are not available, use hand sanitizer. °? Change your dressing as told by your health care provider. °? Leave stitches (sutures), skin glue, or adhesive strips in place. These skin closures may need to stay in place for 2 weeks or longer. If adhesive strip edges start to loosen and curl up, you may trim the loose edges. Do not remove adhesive strips completely unless your health care provider tells you to do that. °· Check your port insertion site every day for signs of infection. Check for: °? More redness, swelling, or pain. °? More fluid or blood. °? Warmth. °? Pus or a bad smell. °General instructions °· Do not take baths, swim, or use a hot tub until your health care provider approves. °· Do not lift anything that is heavier than 10 lb (4.5  kg) for a week, or as told by your health care provider. °· Ask your health care provider when it is okay to: °? Return to work or school. °? Resume usual physical activities or sports. °· Do not drive for 24 hours if you were given a medicine to help you relax (sedative). °· Take over-the-counter and prescription medicines only as told by your health care provider. °· Wear a medical alert bracelet in case of an emergency. This will tell any health care providers that you have a port. °· Keep all follow-up visits as told by your health care provider. This is important. °Contact a health care provider if: °· You cannot flush your port with saline as directed, or you cannot draw blood from the port. °· You have a fever or chills. °· You have more redness, swelling, or pain around your port insertion site. °· You have more fluid or blood coming from your port insertion site. °· Your port insertion site feels warm to the touch. °· You have pus or a bad smell coming from the port insertion site. °Get help right away if: °· You have chest pain or shortness of breath. °· You have bleeding from your port that you cannot control. °Summary °· Take care of the port as told by your health care provider. °· Change your dressing as told by your health care provider. °· Keep all follow-up visits as told by your health care provider. °  This information is not intended to replace advice given to you by your health care provider. Make sure you discuss any questions you have with your health care provider. °Document Released: 10/18/2012 Document Revised: 11/19/2015 Document Reviewed: 11/19/2015 °Elsevier Interactive Patient Education © 2017 [employer] ° °

## 2016-04-20 NOTE — Brief Op Note (Signed)
      [address]       [place] [address]             [phone]     04/20/2016  12:30 PM  PATIENT:  Tyler Pitts  83 y.o. male  PRE-OPERATIVE DIAGNOSIS:  Lung Cancer  POST-OPERATIVE DIAGNOSIS:  same  PROCEDURE:  Procedure(s): INSERTION PORT-A-CATH (Left)- left subclaviab vein With Fluro and sonosite   SURGEON:  Surgeon(s) and Role:    * Grace Isaac, MD - Primary   ANESTHESIA:   MAC  EBL:  Total I/O In: 650 [I.V.:600; IV Piggyback:50] Out: 10 [Blood:10]  BLOOD ADMINISTERED:none  DRAINS: none and left port cath placed    LOCAL MEDICATIONS USED:  LIDOCAINE  and Amount: 8 ml  SPECIMEN:  No Specimen  DISPOSITION OF SPECIMEN:  N/A  COUNTS:  YES  DICTATION: .Dragon Dictation  PLAN OF CARE: Discharge to home after PACU  PATIENT DISPOSITION:  PACU - hemodynamically stable.   Delay start of Pharmacological VTE agent (>24hrs) due to surgical blood loss or risk of bleeding: yes

## 2016-04-20 NOTE — Anesthesia Postprocedure Evaluation (Addendum)
Anesthesia Post Note  Patient: Tyler Pitts  Procedure(s) Performed: Procedure(s) (LRB): INSERTION PORT-A-CATH (Left)  Patient location during evaluation: PACU Anesthesia Type: MAC Level of consciousness: awake and alert Pain management: pain level controlled Vital Signs Assessment: post-procedure vital signs reviewed and stable Respiratory status: spontaneous breathing, nonlabored ventilation, respiratory function stable and patient connected to nasal cannula oxygen Cardiovascular status: stable and blood pressure returned to baseline Anesthetic complications: no       Last Vitals:  Vitals:   04/20/16 1315 04/20/16 1330  BP:  (!) 152/76  Pulse: 69   Resp: 17   Temp:  36.5 C    Last Pain:  Vitals:   04/20/16 1312  TempSrc:   PainSc: 0-No pain                 Tyreese Thain

## 2016-04-20 NOTE — H&P (Addendum)
[address]       [place] [address]             [phone]                                                   Maxson J Greiner [place] Medical Record #[id number] Date of Birth: 06-24-1924  Referring: No ref. provider found Primary Care: Curlene Labrum, MD  Chief Complaint:        Lung Mass     f/u to review PET Scan and MRI Brain    History of Present Illness:    Tyler Pitts 83 y.o. male is seen in the office   for Abnormal CT of the chest.Patient notes that in February 2018 he had left chest discomfort. A chest x-ray was done and he was treated for pneumonia follow-up CT scan suggested a left hilar mass and the patient was referred to thoracic surgery.  Patient quit smoking in 1975 the day his father was diagnosed with lung cancer   Since I saw the patient several days ago PET scan has been performed and MRI of the brain. The patient comes in today to arrange for port placement . The patient has f seen  Dr.Neijstrom .   Current Activity/ Functional Status:  Patient is independent with mobility/ambulation, transfers, ADL's, IADL's.   Zubrod Score: At the time of surgery this patient's most appropriate activity status/level should be described as: '[]'$     0    Normal activity, no symptoms '[x]'$     1    Restricted in physical strenuous activity but ambulatory, able to do out light work '[]'$     2    Ambulatory and capable of self care, unable to do work activities, up and about               >50 % of waking hours                              '[]'$     3    Only limited self care, in bed greater than 50% of waking hours '[]'$     4    Completely disabled, no self care, confined to bed or chair '[]'$     5    Moribund       Past Medical History:  Diagnosis Date  . Arthritis   . Cancer [facility])    recently dx with lung cancer  . GERD (gastroesophageal reflux disease)    'sometimes"  . Hepatic lesions 03/17/2016   PER CT CHEST  . Hilar  adenopathy 03/17/2016   PER CT CHEST   . History of ERCP   . History of kidney stones    about 50 yrs ago  . History of pancreatitis 2001  . HOH (hard of hearing)    bilateral hearing aids  . Hypertension   . Mass of upper lobe of left lung 03/17/2016   PER CT CHEST 03/17/16  . Pneumonia    left sided  . Shortness of breath          Past Surgical History:  Procedure Laterality Date  . CHOLECYSTECTOMY    . COLONOSCOPY  04/02/2011   Procedure: COLONOSCOPY;  Surgeon: Rogene Houston, MD;  [facility];  Service:  Endoscopy;  Laterality: N/A;  1030  . COLONOSCOPY N/A 05/09/2014   Procedure: COLONOSCOPY;  Surgeon: Rogene Houston, MD;  Location: [facility];  Service: Endoscopy;  Laterality: N/A;  1030  . ERCP    . EYE SURGERY     bilateral cataracts  . Left hip surgery Left 1994   DUE TO FALL.Marland KitchenHAD BACK FX AT THE SAME TIME         Family History  Problem Relation Age of Onset  . Lung cancer Mother   . Lung cancer Father   . Colon cancer Neg Hx     Social History        Social History  . Marital status: Married    Spouse name: N/A  . Number of children: N/A  . Years of education: N/A      Occupational History  . Not on file.        Social History Main Topics  . Smoking status: Former Smoker    Packs/day: 4.00    Years: 25.00  . Smokeless tobacco: Not on file  . Alcohol use No  . Drug use: No  . Sexual activity: Not on file          History  Smoking Status  . Former Smoker  . Packs/day: 4.00  . Years: 25.00  . Quit date: 04/05/1973  Smokeless Tobacco  . Never Used       History  Alcohol Use No          Allergies  Allergen Reactions  . Phenergan [Promethazine Hcl] Other (See Comments)    HYPER, UNABLE TO SLEEP  . Vitamin D Analogs Other (See Comments)    CONSTIPATION             Current Facility-Administered Medications  Medication Dose Route Frequency Provider Last Rate  Last Dose  . 0.9 % irrigation (POUR BTL)    PRN Grace Isaac, MD      . 1 mL EPINEPHrine 1 mg/mL (1:1000) in 0.9% normal saline (250 mL) irrigation    PRN Grace Isaac, MD      . lactated ringers infusion   Intravenous Once Rica Koyanagi, MD          Review of Systems:               Cardiac Review of Systems: Y or N             Chest Pain [  n  ]         Resting SOB [ n  ]      Exertional SOB  Blue.Reese  ]   Pontianus.Latina [  ]             Pedal Edema [   ]        Palpitations [  ]            Syncope  [  ]    Presyncope [   ]             General Review of Systems: [Y] = yes [  ]=no Constitional: recent weight change [  ];  Wt loss over the last 3 months [   ] anorexia [  ]; fatigue [  ]; nausea [  ]; night sweats [  ]; fever [  ]; or chills [  ];          Dental: poor dentition[  ]; Last Dentist visit:  Eye : blurred vision [  ]; diplopia [   ]; vision changes [  ];  Amaurosis fugax[  ]; Resp: cough Blue.Reese  ];  wheezing[n  ];  hemoptysis[ n ]; shortness of breath[ n ]; paroxysmal nocturnal dyspnea[  ]; dyspnea on exertion[  ]; or orthopnea[  ];  GI:  gallstones[  ], vomiting[  ];  dysphagia[  ]; melena[  ];  hematochezia [  ]; heartburn[  ];   Hx of  Colonoscopy[  ]; GU: kidney stones [  ]; hematuria[  ];   dysuria [  ];  nocturia[  ];  history of     obstruction [  ]; urinary frequency [  ]             Skin: rash, swelling[  ];, hair loss[  ];  peripheral edema[  ];  or itching[  ]; Musculosketetal: myalgias[  ];  joint swelling[  ];  joint erythema[  ];  joint pain[  ];  back pain[  ];             Heme/Lymph: bruising[  ];  bleeding[  ];  anemia[  ];  Neuro: TIA[  ];  headaches[  ];  stroke[  ];  vertigo[  ];  seizures[  ];   paresthesias[  ];  difficulty walking[ n ];             Psych:depression[  ]; anxiety[  ];             Endocrine: diabetes[  ];  thyroid dysfunction[  ];             Immunizations: Flu up to date [ y ]; Pneumococcal up to date Blue.Reese  ];              Other:  Physical Exam: BP (!) 180/79   Pulse 100   Temp 97.8 F (36.6 C) (Oral)   Resp 18   Wt 163 lb (73.9 kg)   SpO2 99%   BMI 26.31 kg/m   PHYSICAL EXAMINATION:  Physical Exam  Constitutional: He is oriented to person, place, and time and well-developed, well-nourished, and in no distress.  HENT:  Head: Normocephalic.  Eyes: Pupils are equal, round, and reactive to light.  Neck: Normal range of motion. Neck supple. No JVD present. No tracheal deviation present. No thyromegaly present.  Cardiovascular: Normal rate.  PMI is not displaced.   No murmur heard. Pulmonary/Chest: No stridor. No respiratory distress. He has no wheezes. He has no rales.  Abdominal: He exhibits no distension. There is no tenderness.  Musculoskeletal: Normal range of motion.  Lymphadenopathy:    He has no cervical adenopathy.    He has no axillary adenopathy.       Right: No inguinal and no supraclavicular adenopathy present.       Left: No inguinal and no supraclavicular adenopathy present.  Neurological: He is alert and oriented to person, place, and time.  Skin: Skin is warm and dry.  Psychiatric: Affect normal.     Diagnostic Studies & Laboratory data:     Recent Radiology Findings:   Mr Jeri Cos Wo Contrast  Result Date: 04/01/2016 CLINICAL DATA:  83 year old male with recent diagnosis of hypermetabolic left upper lobe lung mass most compatible with lung cancer. Staging. Subsequent encounter. EXAM: MRI HEAD WITHOUT AND WITH CONTRAST TECHNIQUE: Multiplanar, multiecho pulse sequences of the brain and surrounding structures were obtained without and with intravenous contrast. CONTRAST:  61m MULTIHANCE GADOBENATE DIMEGLUMINE  529 MG/ML IV SOLN COMPARISON:  PET-CT 03/30/2016 and earlier. FINDINGS: Brain: No midline shift, mass effect, or evidence of intracranial mass lesion. No abnormal enhancement identified. No dural thickening. Cerebral volume is within normal limits for age. No restricted  diffusion to suggest acute infarction. No ventriculomegaly, extra-axial collection or acute intracranial hemorrhage. Cervicomedullary junction and pituitary are within normal limits. Pearline Cables and white matter signal is within normal limits for age throughout the brain. No cortical encephalomalacia or chronic cerebral blood products. Vascular: Major intracranial vascular flow voids are preserved. Skull and upper cervical spine: Negative. Visualized bone marrow signal is within normal limits. Sinuses/Orbits: Postoperative changes to the globes, otherwise normal orbits soft tissues. Visualized paranasal sinuses and mastoids are well pneumatized. Other: Visible internal auditory structures appear normal. Negative scalp soft tissues. IMPRESSION: No metastatic disease or acute intracranial abnormality. Normal for age MRI appearance of the brain. Electronically Signed   By: Genevie Ann M.D.   On: 04/01/2016 11:32   Nm Pet Image Initial (pi) Skull Base To Thigh  Result Date: 03/30/2016 CLINICAL DATA:  Initial treatment strategy for left upper lobe lung mass. EXAM: NUCLEAR MEDICINE PET SKULL BASE TO THIGH TECHNIQUE: Twelve point head mCi F-18 FDG was injected intravenously. Full-ring PET imaging was performed from the skull base to thigh after the radiotracer. CT data was obtained and used for attenuation correction and anatomic localization. FASTING BLOOD GLUCOSE:  Value: 93 mg/dl COMPARISON:  CT chest 03/17/2016 FINDINGS: NECK No hypermetabolic lymph nodes in the neck. CHEST 3.3 by 4.3 cm central left upper lobe mass with maximum SUV 17.9. Adjacent adenopathy anterior to the left pulmonary artery, short axis diameter 1.5 cm, maximum SUV 18.1. Postobstructive atelectasis/ pneumonitis in the left upper lobe. The mass significantly narrows the left superior lobar bronchus. No other hypermetabolic nodules identified. Atherosclerotic aortic arch. ABDOMEN/PELVIS No abnormal hypermetabolic activity within the liver, pancreas,  adrenal glands, or spleen. No hypermetabolic lymph nodes in the abdomen or pelvis. Calcification associated with the right posterior prostatic apex has some faint but measurable hypermetabolic activity with maximum SUV 6.9. This persists on the an corrected images and accordingly I do not feel that it is only due to attenuation correction related to the calcification. Enlarged prostate gland. Aortoiliac atherosclerotic vascular disease. Faint stranding in the mesentery. SKELETON There is a left hip prosthesis noted with accentuated activity in the vicinity of the prosthesis is specially anteriorly, probably mostly due to partially attributable to attenuation correction and partially attributable to inflammation. I am skeptical of metastatic disease to this area. IMPRESSION: 1. 4.3 cm central left upper lobe mass, maximum SUV 17.9, with adjacent adenopathy in the ipsilateral mediastinum, and associated postobstructive atelectasis/ pneumonitis in the left upper lobe. This mass significantly narrows the left superior lobar bronchus. Localized pleural/mediastinal invasion is not readily excluded given the morphology of the mass. If this is non-small cell lung cancer than appearance is compatible with T2a N2 M0 disease (stage IIIA). 2. Faint hypermetabolic activity associated with calcification in the right posterior prostatic apex. The prostate gland is enlarged. Correlate with PSA level in determining need for further workup. 3. Atherosclerosis. 4. Left hip prosthesis, activity in the vicinity of the prosthesis is likely inflammatory. Electronically Signed   By: Van Clines M.D.   On: 03/30/2016 15:37    Ct Chest W Contrast  Result Date: 03/17/2016 CLINICAL DATA:  Abnormal chest x-ray 03/07/2016 EXAM: CT CHEST WITH CONTRAST TECHNIQUE: Multidetector CT imaging of the chest was performed during intravenous contrast administration. CONTRAST:  27m ISOVUE-300 IOPAMIDOL (ISOVUE-300) INJECTION 61% COMPARISON:   None. FINDINGS: Cardiovascular: Atherosclerotic calcifications of thoracic aorta. Heart size within normal limits. No pericardial effusion. Mediastinum/Nodes: Tiny hiatal hernia is noted. There is a left upper mediastinal lymph node just anterior to main pulmonary artery measures 1.8 cm highly suspicious for metastatic adenopathy. No right hilar adenopathy is noted. No sub- carinal adenopathy. Trachea and bilateral main bronchus is patent. Axial image 60 there is left upper lobe confluent mass or adenopathy abutting the left anterior mediastinum measures at least 4 cm. This is highly suspicious for mass. Second left anterior mediastinal lymph node measures 1.2 cm suspicious for metastatic adenopathy. Lungs/Pleura: There is partial obstruction of the left upper lobe anteromedial bronchus. There is postobstructive pneumonia or infiltrative tumor spread in left upper lobe anteromedially axial image 62. Mild peripheral pleural enhancement suspicious for pleural involvement. Further evaluation with bronchoscopy and PET scan and surgical thoracic consult is recommended. There is small left posterior pleural effusion. Mild emphysematous changes noted bilateral upper lobes. Mild with left basilar posterior pleural thickening. Upper Abdomen: The visualized upper abdomen shows indeterminate low-density lesion within left hepatic lobe measures 9.5 mm. A there is a low-density lesion in right hepatic dome anteriorly. Metastatic disease cannot be excluded. No adrenal gland mass. Visualized pancreas and spleen is unremarkable. Musculoskeletal: No destructive bony lesions are noted. Sagittal images of the spine show mild degenerative change thoracic spine. Sagittal view of the sternum is unremarkable. No rib lesions are identified. IMPRESSION: 1. There is confluent mass or adenopathy in left upper lobe suprahilar region anteriorly abutting the mediastinum measures at least 4 cm. At least 2 left anterior mediastinal lymph nodes are  noted suspicious for metastatic disease. This is highly suspicious for malignancy. Further correlation with bronchoscopy PET scan and thoracic surgical consult is recommended. There is postobstructive pneumonia or tumor spread in left upper lobe anteromedially. There is mild pleural enhancement. Pleural metastatic disease cannot be excluded. Partial obstruction of the left upper lobe anteromedial bronchus. 2. No sub- carinal or right hilar adenopathy. 3. The right lung is clear. Small left pleural effusion left basilar posterior atelectasis. There is mild pleural thickening in left base posteriorly laterally. 4. Indeterminate low-density lesions within liver the largest measures 9.5 mm in left hepatic lobe. Further evaluation with enhanced CT or MRI is recommended to exclude metastatic disease. No adrenal gland mass. 5. Degenerative changes thoracic spine. No destructive bony lesions are noted. Electronically Signed   By: LLahoma CrockerM.D.   On: 03/17/2016 08:30     I have independently reviewed the above radiologic studies.  Recent Lab Findings: Recent Labs       Lab Results  Component Value Date   WBC 8.0 04/05/2016   HGB 13.9 04/05/2016   HCT 42.3 04/05/2016   PLT 201 04/05/2016   GLUCOSE 105 (H) 04/05/2016   ALT 16 (L) 04/05/2016   AST 17 04/05/2016   NA 142 04/05/2016   K 3.9 04/05/2016   CL 107 04/05/2016   CREATININE 0.76 04/05/2016   BUN 15 04/05/2016   CO2 26 04/05/2016   INR 1.07 04/05/2016        Assessment / Plan:   advanced Stage  lung cancer left upper lobe with partial lung collapse T2a N2 M0 disease (stage IIIA)   Patient seen by Oncology returns today for placement of portacath.    I discussed with the patient and his family proceeding with port placement .  Patient is concerned about urinary issues after surgery,  because he developed significant urinary retention after a fall and fractured hip. Last week he developed urinary retention and was  [facility]     The goals risks and alternatives of the planned surgical procedure Procedure(s): INSERTION PORT-A-CATH (Bilateral)  have been discussed with the patient in detail. The risks of the procedure including death, infection, stroke, , bleeding, blood transfusion pneumothorax  have all been discussed specifically.  I have quoted Erlinda Hong a 1 % of perioperative mortality and a complication rate as high as 20 %. The patient's questions have been answered.BASSAM DRESCH is willing  to proceed with the planned procedure. Grace Isaac MD      [address] [place] [address] Office [phone]                Beeper [phone]  04/06/2016 2:16 PM

## 2016-04-20 NOTE — Anesthesia Preprocedure Evaluation (Addendum)
Anesthesia Evaluation  Patient identified by MRN, date of birth, ID band Patient awake    Reviewed: Allergy & Precautions, H&P , NPO status , Patient's Chart, lab work & pertinent test results  History of Anesthesia Complications (+) PROLONGED EMERGENCE and history of anesthetic complications  Airway Mallampati: III  TM Distance: >3 FB Neck ROM: full    Dental  (+) Dental Advidsory Given, Teeth Intact   Pulmonary shortness of breath and with exertion, former smoker,    breath sounds clear to auscultation       Cardiovascular hypertension,  Rhythm:Regular Rate:Normal     Neuro/Psych negative neurological ROS  negative psych ROS   GI/Hepatic Neg liver ROS, GERD  Medicated and Controlled,  Endo/Other  negative endocrine ROS  Renal/GU negative Renal ROS  negative genitourinary   Musculoskeletal  (+) Arthritis ,   Abdominal   Peds negative pediatric ROS (+)  Hematology negative hematology ROS (+)   Anesthesia Other Findings Postoperative urinary retention with general anesthesia.  Reproductive/Obstetrics                           Anesthesia Physical Anesthesia Plan  ASA: III  Anesthesia Plan: MAC   Post-op Pain Management:    Induction: Intravenous  Airway Management Planned: Natural Airway  Additional Equipment:   Intra-op Plan:   Post-operative Plan:   Informed Consent: I have reviewed the patients History and Physical, chart, labs and discussed the procedure including the risks, benefits and alternatives for the proposed anesthesia with the patient or authorized representative who has indicated his/her understanding and acceptance.   Dental Advisory Given  Plan Discussed with: Surgeon, CRNA and Anesthesiologist  Anesthesia Plan Comments:        Anesthesia Quick Evaluation

## 2016-04-21 ENCOUNTER — Encounter (HOSPITAL_COMMUNITY): Payer: Self-pay | Admitting: Cardiothoracic Surgery

## 2016-04-21 DIAGNOSIS — Z87891 Personal history of nicotine dependence: Secondary | ICD-10-CM | POA: Diagnosis not present

## 2016-04-21 DIAGNOSIS — Z510 Encounter for antineoplastic radiation therapy: Secondary | ICD-10-CM | POA: Diagnosis not present

## 2016-04-21 DIAGNOSIS — C3412 Malignant neoplasm of upper lobe, left bronchus or lung: Secondary | ICD-10-CM | POA: Diagnosis not present

## 2016-04-21 NOTE — Op Note (Signed)
NAME:  Tyler Pitts, Tyler Pitts NO.:  MEDICAL RECORD NO.:  [id number]  LOCATION:                                 FACILITY:  PHYSICIAN:  Lanelle Bal, MD         DATE OF BIRTH:  DATE OF PROCEDURE:  04/20/2016 DATE OF DISCHARGE:                              OPERATIVE REPORT   PREOPERATIVE DIAGNOSIS:  Vascular access for chemotherapy.  POSTOPERATIVE DIAGNOSIS:  Vascular access for chemotherapy.  PROCEDURE:  Placement of left subclavian vein Port-A-Cath.With Ultrasound and fluro   SURGEON:  Lanelle Bal, M.D.  BRIEF HISTORY:  The patient is an 83 year old male, in very good functional status, who presented with stage III lung cancer, who is now starting concomitant chemotherapy and radiation and presents with need for Port-A-Cath.  Risks and options were discussed with the patient in detail.  He agreed and signed informed consent.  DESCRIPTION OF PROCEDURE:  Under MAC anesthesia, chest both on the right and the left and right and left neck were prepped with Betadine and draped in sterile manner.  Appropriate time-out was performed.  The patient was right handed.  So, we attempted left side first.  SonoSite was used to confirm the site of the subclavian vein.  A 1% lidocaine was infiltrated in the left infraclavicular area and across the anterior chest wall.  With the head in the down position, a 16-gauge needle was introduced into the left subclavian vein without difficulty and with single stick and free return of blood through the needle and under fluoroscopic guidance, a guidewire was passed into the left subclavian vein and into the superior vena cava.  The position of the wire was confirmed on fluoroscopy.  We then created a subcutaneous pocket on the left anterior chest wall and tunneled a preattached 9.5-French femoral port into the pocket and brought it out at the insertion site.  Over the guidewire, a peel-away sheath dilator was passed.  Guidewire  and the dilator were removed and the Port-A-Cath trimmed to the appropriate length was passed through this peel-away sheath and positioned in the superior vena cava at the right atrial junction.  There was free return of blood through the port.  Port was flushed with heparinized saline and then instilled with 3 mL of 1000 units/mL heparin solution.  The port was anchored to the anterior chest wall.  The incision was closed with interrupted 3-0 Vicryl and a 4-0 subcuticular stitch.  Dermabond was applied.  Fluoroscopy, at the completion of the case, confirmed the position of the Port-A-Cath and the lack of pneumothorax on the left. The patient tolerated the procedure without obvious complication.  Sponge and needle count was reported as correct.  Blood loss was minimal.  The patient was returned to the recovery room for postoperative observation and postoperative PA and lateral chest x-ray.     Lanelle Bal, MD     EG/MEDQ  D:  04/21/2016  T:  04/21/2016  Job:  [id number]

## 2016-04-27 DIAGNOSIS — C3412 Malignant neoplasm of upper lobe, left bronchus or lung: Secondary | ICD-10-CM | POA: Diagnosis not present

## 2016-04-28 DIAGNOSIS — C3412 Malignant neoplasm of upper lobe, left bronchus or lung: Secondary | ICD-10-CM | POA: Diagnosis not present

## 2016-05-03 DIAGNOSIS — Z5111 Encounter for antineoplastic chemotherapy: Secondary | ICD-10-CM | POA: Diagnosis not present

## 2016-05-03 DIAGNOSIS — C3412 Malignant neoplasm of upper lobe, left bronchus or lung: Secondary | ICD-10-CM | POA: Diagnosis not present

## 2016-05-03 DIAGNOSIS — I10 Essential (primary) hypertension: Secondary | ICD-10-CM | POA: Diagnosis not present

## 2016-05-07 DIAGNOSIS — I10 Essential (primary) hypertension: Secondary | ICD-10-CM | POA: Diagnosis not present

## 2016-05-07 DIAGNOSIS — C3412 Malignant neoplasm of upper lobe, left bronchus or lung: Secondary | ICD-10-CM | POA: Diagnosis not present

## 2016-05-07 DIAGNOSIS — Z5111 Encounter for antineoplastic chemotherapy: Secondary | ICD-10-CM | POA: Diagnosis not present

## 2016-05-10 DIAGNOSIS — C3412 Malignant neoplasm of upper lobe, left bronchus or lung: Secondary | ICD-10-CM | POA: Diagnosis not present

## 2016-05-10 DIAGNOSIS — Z510 Encounter for antineoplastic radiation therapy: Secondary | ICD-10-CM | POA: Diagnosis not present

## 2016-05-10 DIAGNOSIS — Z7722 Contact with and (suspected) exposure to environmental tobacco smoke (acute) (chronic): Secondary | ICD-10-CM | POA: Diagnosis not present

## 2016-05-10 DIAGNOSIS — Z5111 Encounter for antineoplastic chemotherapy: Secondary | ICD-10-CM | POA: Diagnosis not present

## 2016-05-10 DIAGNOSIS — I10 Essential (primary) hypertension: Secondary | ICD-10-CM | POA: Diagnosis not present

## 2016-05-10 DIAGNOSIS — Z87891 Personal history of nicotine dependence: Secondary | ICD-10-CM | POA: Diagnosis not present

## 2016-05-10 DIAGNOSIS — Z801 Family history of malignant neoplasm of trachea, bronchus and lung: Secondary | ICD-10-CM | POA: Diagnosis not present

## 2016-05-11 DIAGNOSIS — C3412 Malignant neoplasm of upper lobe, left bronchus or lung: Secondary | ICD-10-CM | POA: Diagnosis not present

## 2016-05-11 DIAGNOSIS — Z510 Encounter for antineoplastic radiation therapy: Secondary | ICD-10-CM | POA: Diagnosis not present

## 2016-05-17 DIAGNOSIS — Z5111 Encounter for antineoplastic chemotherapy: Secondary | ICD-10-CM | POA: Diagnosis not present

## 2016-05-17 DIAGNOSIS — C3412 Malignant neoplasm of upper lobe, left bronchus or lung: Secondary | ICD-10-CM | POA: Diagnosis not present

## 2016-05-24 DIAGNOSIS — Z5111 Encounter for antineoplastic chemotherapy: Secondary | ICD-10-CM | POA: Diagnosis not present

## 2016-05-24 DIAGNOSIS — C3412 Malignant neoplasm of upper lobe, left bronchus or lung: Secondary | ICD-10-CM | POA: Diagnosis not present

## 2016-05-25 DIAGNOSIS — C3412 Malignant neoplasm of upper lobe, left bronchus or lung: Secondary | ICD-10-CM | POA: Diagnosis not present

## 2016-05-25 DIAGNOSIS — Z510 Encounter for antineoplastic radiation therapy: Secondary | ICD-10-CM | POA: Diagnosis not present

## 2016-05-26 DIAGNOSIS — Z510 Encounter for antineoplastic radiation therapy: Secondary | ICD-10-CM | POA: Diagnosis not present

## 2016-05-26 DIAGNOSIS — C3412 Malignant neoplasm of upper lobe, left bronchus or lung: Secondary | ICD-10-CM | POA: Diagnosis not present

## 2016-05-27 DIAGNOSIS — C3412 Malignant neoplasm of upper lobe, left bronchus or lung: Secondary | ICD-10-CM | POA: Diagnosis not present

## 2016-05-27 DIAGNOSIS — Z510 Encounter for antineoplastic radiation therapy: Secondary | ICD-10-CM | POA: Diagnosis not present

## 2016-05-28 DIAGNOSIS — Z510 Encounter for antineoplastic radiation therapy: Secondary | ICD-10-CM | POA: Diagnosis not present

## 2016-05-28 DIAGNOSIS — C3412 Malignant neoplasm of upper lobe, left bronchus or lung: Secondary | ICD-10-CM | POA: Diagnosis not present

## 2016-05-31 DIAGNOSIS — Z5111 Encounter for antineoplastic chemotherapy: Secondary | ICD-10-CM | POA: Diagnosis not present

## 2016-05-31 DIAGNOSIS — C3412 Malignant neoplasm of upper lobe, left bronchus or lung: Secondary | ICD-10-CM | POA: Diagnosis not present

## 2016-05-31 DIAGNOSIS — Z510 Encounter for antineoplastic radiation therapy: Secondary | ICD-10-CM | POA: Diagnosis not present

## 2016-05-31 DIAGNOSIS — Z87891 Personal history of nicotine dependence: Secondary | ICD-10-CM | POA: Diagnosis not present

## 2016-06-01 DIAGNOSIS — C3412 Malignant neoplasm of upper lobe, left bronchus or lung: Secondary | ICD-10-CM | POA: Diagnosis not present

## 2016-06-01 DIAGNOSIS — Z510 Encounter for antineoplastic radiation therapy: Secondary | ICD-10-CM | POA: Diagnosis not present

## 2016-06-01 DIAGNOSIS — Z87891 Personal history of nicotine dependence: Secondary | ICD-10-CM | POA: Diagnosis not present

## 2016-06-02 DIAGNOSIS — Z510 Encounter for antineoplastic radiation therapy: Secondary | ICD-10-CM | POA: Diagnosis not present

## 2016-06-02 DIAGNOSIS — C3412 Malignant neoplasm of upper lobe, left bronchus or lung: Secondary | ICD-10-CM | POA: Diagnosis not present

## 2016-06-02 DIAGNOSIS — Z87891 Personal history of nicotine dependence: Secondary | ICD-10-CM | POA: Diagnosis not present

## 2016-06-03 DIAGNOSIS — Z87891 Personal history of nicotine dependence: Secondary | ICD-10-CM | POA: Diagnosis not present

## 2016-06-03 DIAGNOSIS — Z510 Encounter for antineoplastic radiation therapy: Secondary | ICD-10-CM | POA: Diagnosis not present

## 2016-06-03 DIAGNOSIS — C3412 Malignant neoplasm of upper lobe, left bronchus or lung: Secondary | ICD-10-CM | POA: Diagnosis not present

## 2016-06-04 DIAGNOSIS — C3412 Malignant neoplasm of upper lobe, left bronchus or lung: Secondary | ICD-10-CM | POA: Diagnosis not present

## 2016-06-04 DIAGNOSIS — Z87891 Personal history of nicotine dependence: Secondary | ICD-10-CM | POA: Diagnosis not present

## 2016-06-04 DIAGNOSIS — Z510 Encounter for antineoplastic radiation therapy: Secondary | ICD-10-CM | POA: Diagnosis not present

## 2016-06-08 DIAGNOSIS — Z5111 Encounter for antineoplastic chemotherapy: Secondary | ICD-10-CM | POA: Diagnosis not present

## 2016-06-08 DIAGNOSIS — Z87891 Personal history of nicotine dependence: Secondary | ICD-10-CM | POA: Diagnosis not present

## 2016-06-08 DIAGNOSIS — C3412 Malignant neoplasm of upper lobe, left bronchus or lung: Secondary | ICD-10-CM | POA: Diagnosis not present

## 2016-06-08 DIAGNOSIS — Z510 Encounter for antineoplastic radiation therapy: Secondary | ICD-10-CM | POA: Diagnosis not present

## 2016-06-09 DIAGNOSIS — Z87891 Personal history of nicotine dependence: Secondary | ICD-10-CM | POA: Diagnosis not present

## 2016-06-09 DIAGNOSIS — C3412 Malignant neoplasm of upper lobe, left bronchus or lung: Secondary | ICD-10-CM | POA: Diagnosis not present

## 2016-06-09 DIAGNOSIS — Z510 Encounter for antineoplastic radiation therapy: Secondary | ICD-10-CM | POA: Diagnosis not present

## 2016-06-10 DIAGNOSIS — Z510 Encounter for antineoplastic radiation therapy: Secondary | ICD-10-CM | POA: Diagnosis not present

## 2016-06-10 DIAGNOSIS — Z87891 Personal history of nicotine dependence: Secondary | ICD-10-CM | POA: Diagnosis not present

## 2016-06-10 DIAGNOSIS — C3412 Malignant neoplasm of upper lobe, left bronchus or lung: Secondary | ICD-10-CM | POA: Diagnosis not present

## 2016-06-11 DIAGNOSIS — C3412 Malignant neoplasm of upper lobe, left bronchus or lung: Secondary | ICD-10-CM | POA: Diagnosis not present

## 2016-06-11 DIAGNOSIS — Z510 Encounter for antineoplastic radiation therapy: Secondary | ICD-10-CM | POA: Diagnosis not present

## 2016-06-11 NOTE — Addendum Note (Signed)
Addendum  created 06/11/16 1256 by Rica Koyanagi, MD   Sign clinical note

## 2016-06-14 DIAGNOSIS — Z5111 Encounter for antineoplastic chemotherapy: Secondary | ICD-10-CM | POA: Diagnosis not present

## 2016-06-14 DIAGNOSIS — Z510 Encounter for antineoplastic radiation therapy: Secondary | ICD-10-CM | POA: Diagnosis not present

## 2016-06-14 DIAGNOSIS — C3412 Malignant neoplasm of upper lobe, left bronchus or lung: Secondary | ICD-10-CM | POA: Diagnosis not present

## 2016-06-14 NOTE — Addendum Note (Signed)
Addendum  created 06/14/16 1336 by Oleta Mouse, MD   Sign clinical note

## 2016-06-15 DIAGNOSIS — C3412 Malignant neoplasm of upper lobe, left bronchus or lung: Secondary | ICD-10-CM | POA: Diagnosis not present

## 2016-06-15 DIAGNOSIS — Z510 Encounter for antineoplastic radiation therapy: Secondary | ICD-10-CM | POA: Diagnosis not present

## 2016-06-16 DIAGNOSIS — C3412 Malignant neoplasm of upper lobe, left bronchus or lung: Secondary | ICD-10-CM | POA: Diagnosis not present

## 2016-06-16 DIAGNOSIS — Z510 Encounter for antineoplastic radiation therapy: Secondary | ICD-10-CM | POA: Diagnosis not present

## 2016-06-17 DIAGNOSIS — Z510 Encounter for antineoplastic radiation therapy: Secondary | ICD-10-CM | POA: Diagnosis not present

## 2016-06-17 DIAGNOSIS — C3412 Malignant neoplasm of upper lobe, left bronchus or lung: Secondary | ICD-10-CM | POA: Diagnosis not present

## 2016-06-18 DIAGNOSIS — C3412 Malignant neoplasm of upper lobe, left bronchus or lung: Secondary | ICD-10-CM | POA: Diagnosis not present

## 2016-06-18 DIAGNOSIS — Z510 Encounter for antineoplastic radiation therapy: Secondary | ICD-10-CM | POA: Diagnosis not present

## 2016-07-13 DIAGNOSIS — C3412 Malignant neoplasm of upper lobe, left bronchus or lung: Secondary | ICD-10-CM | POA: Diagnosis not present

## 2016-07-13 DIAGNOSIS — C762 Malignant neoplasm of abdomen: Secondary | ICD-10-CM | POA: Diagnosis not present

## 2016-07-13 DIAGNOSIS — J479 Bronchiectasis, uncomplicated: Secondary | ICD-10-CM | POA: Diagnosis not present

## 2016-07-13 DIAGNOSIS — K4020 Bilateral inguinal hernia, without obstruction or gangrene, not specified as recurrent: Secondary | ICD-10-CM | POA: Diagnosis not present

## 2016-07-13 DIAGNOSIS — R918 Other nonspecific abnormal finding of lung field: Secondary | ICD-10-CM | POA: Diagnosis not present

## 2016-07-13 DIAGNOSIS — K769 Liver disease, unspecified: Secondary | ICD-10-CM | POA: Diagnosis not present

## 2016-07-13 DIAGNOSIS — N281 Cyst of kidney, acquired: Secondary | ICD-10-CM | POA: Diagnosis not present

## 2016-07-13 DIAGNOSIS — J984 Other disorders of lung: Secondary | ICD-10-CM | POA: Diagnosis not present

## 2016-07-19 DIAGNOSIS — K30 Functional dyspepsia: Secondary | ICD-10-CM | POA: Diagnosis not present

## 2016-07-19 DIAGNOSIS — C3412 Malignant neoplasm of upper lobe, left bronchus or lung: Secondary | ICD-10-CM | POA: Diagnosis not present

## 2016-07-19 DIAGNOSIS — Z5111 Encounter for antineoplastic chemotherapy: Secondary | ICD-10-CM | POA: Diagnosis not present

## 2016-07-19 DIAGNOSIS — Z95828 Presence of other vascular implants and grafts: Secondary | ICD-10-CM | POA: Diagnosis not present

## 2016-07-20 ENCOUNTER — Telehealth: Payer: Self-pay | Admitting: Medical Oncology

## 2016-07-20 NOTE — Telephone Encounter (Signed)
Faxed PDL-1 to Dr Tressie Stalker with note to contact [facility] for questions

## 2016-07-21 DIAGNOSIS — Z510 Encounter for antineoplastic radiation therapy: Secondary | ICD-10-CM | POA: Diagnosis not present

## 2016-07-21 DIAGNOSIS — C3412 Malignant neoplasm of upper lobe, left bronchus or lung: Secondary | ICD-10-CM | POA: Diagnosis not present

## 2016-07-31 DIAGNOSIS — H9113 Presbycusis, bilateral: Secondary | ICD-10-CM | POA: Diagnosis not present

## 2016-07-31 DIAGNOSIS — N401 Enlarged prostate with lower urinary tract symptoms: Secondary | ICD-10-CM | POA: Diagnosis not present

## 2016-07-31 DIAGNOSIS — I10 Essential (primary) hypertension: Secondary | ICD-10-CM | POA: Diagnosis not present

## 2016-07-31 DIAGNOSIS — Z6827 Body mass index (BMI) 27.0-27.9, adult: Secondary | ICD-10-CM | POA: Diagnosis not present

## 2016-07-31 DIAGNOSIS — D0220 Carcinoma in situ of unspecified bronchus and lung: Secondary | ICD-10-CM | POA: Diagnosis not present

## 2016-07-31 DIAGNOSIS — Z0000 Encounter for general adult medical examination without abnormal findings: Secondary | ICD-10-CM | POA: Diagnosis not present

## 2016-07-31 DIAGNOSIS — Z974 Presence of external hearing-aid: Secondary | ICD-10-CM | POA: Diagnosis not present

## 2016-07-31 DIAGNOSIS — K219 Gastro-esophageal reflux disease without esophagitis: Secondary | ICD-10-CM | POA: Diagnosis not present

## 2016-08-02 DIAGNOSIS — K30 Functional dyspepsia: Secondary | ICD-10-CM | POA: Diagnosis not present

## 2016-08-02 DIAGNOSIS — Z5111 Encounter for antineoplastic chemotherapy: Secondary | ICD-10-CM | POA: Diagnosis not present

## 2016-08-02 DIAGNOSIS — C3412 Malignant neoplasm of upper lobe, left bronchus or lung: Secondary | ICD-10-CM | POA: Diagnosis not present

## 2016-08-16 DIAGNOSIS — C3412 Malignant neoplasm of upper lobe, left bronchus or lung: Secondary | ICD-10-CM | POA: Diagnosis not present

## 2016-08-16 DIAGNOSIS — Z79899 Other long term (current) drug therapy: Secondary | ICD-10-CM | POA: Diagnosis not present

## 2016-08-16 DIAGNOSIS — Z5111 Encounter for antineoplastic chemotherapy: Secondary | ICD-10-CM | POA: Diagnosis not present

## 2016-08-30 DIAGNOSIS — C3412 Malignant neoplasm of upper lobe, left bronchus or lung: Secondary | ICD-10-CM | POA: Diagnosis not present

## 2016-08-30 DIAGNOSIS — Z5111 Encounter for antineoplastic chemotherapy: Secondary | ICD-10-CM | POA: Diagnosis not present

## 2016-08-30 DIAGNOSIS — Z79899 Other long term (current) drug therapy: Secondary | ICD-10-CM | POA: Diagnosis not present

## 2016-09-14 DIAGNOSIS — C3412 Malignant neoplasm of upper lobe, left bronchus or lung: Secondary | ICD-10-CM | POA: Diagnosis not present

## 2016-09-14 DIAGNOSIS — Z5111 Encounter for antineoplastic chemotherapy: Secondary | ICD-10-CM | POA: Diagnosis not present

## 2016-09-21 DIAGNOSIS — Z510 Encounter for antineoplastic radiation therapy: Secondary | ICD-10-CM | POA: Diagnosis not present

## 2016-09-21 DIAGNOSIS — C3412 Malignant neoplasm of upper lobe, left bronchus or lung: Secondary | ICD-10-CM | POA: Diagnosis not present

## 2016-09-27 DIAGNOSIS — C3412 Malignant neoplasm of upper lobe, left bronchus or lung: Secondary | ICD-10-CM | POA: Diagnosis not present

## 2016-09-27 DIAGNOSIS — Z5111 Encounter for antineoplastic chemotherapy: Secondary | ICD-10-CM | POA: Diagnosis not present

## 2016-10-07 DIAGNOSIS — J984 Other disorders of lung: Secondary | ICD-10-CM | POA: Diagnosis not present

## 2016-10-07 DIAGNOSIS — J90 Pleural effusion, not elsewhere classified: Secondary | ICD-10-CM | POA: Diagnosis not present

## 2016-10-07 DIAGNOSIS — N281 Cyst of kidney, acquired: Secondary | ICD-10-CM | POA: Diagnosis not present

## 2016-10-07 DIAGNOSIS — N400 Enlarged prostate without lower urinary tract symptoms: Secondary | ICD-10-CM | POA: Diagnosis not present

## 2016-10-07 DIAGNOSIS — C3412 Malignant neoplasm of upper lobe, left bronchus or lung: Secondary | ICD-10-CM | POA: Diagnosis not present

## 2016-10-07 DIAGNOSIS — K7689 Other specified diseases of liver: Secondary | ICD-10-CM | POA: Diagnosis not present

## 2016-10-11 DIAGNOSIS — C3412 Malignant neoplasm of upper lobe, left bronchus or lung: Secondary | ICD-10-CM | POA: Diagnosis not present

## 2016-10-11 DIAGNOSIS — K30 Functional dyspepsia: Secondary | ICD-10-CM | POA: Diagnosis not present

## 2016-10-11 DIAGNOSIS — Z5111 Encounter for antineoplastic chemotherapy: Secondary | ICD-10-CM | POA: Diagnosis not present

## 2016-10-11 DIAGNOSIS — J90 Pleural effusion, not elsewhere classified: Secondary | ICD-10-CM | POA: Diagnosis not present

## 2016-10-25 DIAGNOSIS — J90 Pleural effusion, not elsewhere classified: Secondary | ICD-10-CM | POA: Diagnosis not present

## 2016-10-25 DIAGNOSIS — K30 Functional dyspepsia: Secondary | ICD-10-CM | POA: Diagnosis not present

## 2016-10-25 DIAGNOSIS — Z5111 Encounter for antineoplastic chemotherapy: Secondary | ICD-10-CM | POA: Diagnosis not present

## 2016-10-25 DIAGNOSIS — C3412 Malignant neoplasm of upper lobe, left bronchus or lung: Secondary | ICD-10-CM | POA: Diagnosis not present

## 2016-11-01 DIAGNOSIS — L989 Disorder of the skin and subcutaneous tissue, unspecified: Secondary | ICD-10-CM | POA: Diagnosis not present

## 2016-11-01 DIAGNOSIS — K30 Functional dyspepsia: Secondary | ICD-10-CM | POA: Diagnosis not present

## 2016-11-01 DIAGNOSIS — C3412 Malignant neoplasm of upper lobe, left bronchus or lung: Secondary | ICD-10-CM | POA: Diagnosis not present

## 2016-11-01 DIAGNOSIS — J90 Pleural effusion, not elsewhere classified: Secondary | ICD-10-CM | POA: Diagnosis not present

## 2016-11-01 DIAGNOSIS — Z5111 Encounter for antineoplastic chemotherapy: Secondary | ICD-10-CM | POA: Diagnosis not present

## 2016-11-08 DIAGNOSIS — K30 Functional dyspepsia: Secondary | ICD-10-CM | POA: Diagnosis not present

## 2016-11-08 DIAGNOSIS — C3412 Malignant neoplasm of upper lobe, left bronchus or lung: Secondary | ICD-10-CM | POA: Diagnosis not present

## 2016-11-08 DIAGNOSIS — Z5111 Encounter for antineoplastic chemotherapy: Secondary | ICD-10-CM | POA: Diagnosis not present

## 2016-11-08 DIAGNOSIS — J90 Pleural effusion, not elsewhere classified: Secondary | ICD-10-CM | POA: Diagnosis not present

## 2016-11-09 DIAGNOSIS — R69 Illness, unspecified: Secondary | ICD-10-CM | POA: Diagnosis not present

## 2016-11-10 DIAGNOSIS — R69 Illness, unspecified: Secondary | ICD-10-CM | POA: Diagnosis not present

## 2016-11-22 DIAGNOSIS — R3911 Hesitancy of micturition: Secondary | ICD-10-CM | POA: Diagnosis not present

## 2016-11-22 DIAGNOSIS — C3412 Malignant neoplasm of upper lobe, left bronchus or lung: Secondary | ICD-10-CM | POA: Diagnosis not present

## 2016-11-22 DIAGNOSIS — N401 Enlarged prostate with lower urinary tract symptoms: Secondary | ICD-10-CM | POA: Diagnosis not present

## 2016-11-22 DIAGNOSIS — Z923 Personal history of irradiation: Secondary | ICD-10-CM | POA: Diagnosis not present

## 2016-11-22 DIAGNOSIS — Z5112 Encounter for antineoplastic immunotherapy: Secondary | ICD-10-CM | POA: Diagnosis not present

## 2016-12-06 DIAGNOSIS — C3412 Malignant neoplasm of upper lobe, left bronchus or lung: Secondary | ICD-10-CM | POA: Diagnosis not present

## 2016-12-06 DIAGNOSIS — Z5112 Encounter for antineoplastic immunotherapy: Secondary | ICD-10-CM | POA: Diagnosis not present

## 2016-12-06 DIAGNOSIS — R3911 Hesitancy of micturition: Secondary | ICD-10-CM | POA: Diagnosis not present

## 2016-12-07 DIAGNOSIS — N281 Cyst of kidney, acquired: Secondary | ICD-10-CM | POA: Diagnosis not present

## 2016-12-07 DIAGNOSIS — J90 Pleural effusion, not elsewhere classified: Secondary | ICD-10-CM | POA: Diagnosis not present

## 2016-12-07 DIAGNOSIS — J984 Other disorders of lung: Secondary | ICD-10-CM | POA: Diagnosis not present

## 2016-12-07 DIAGNOSIS — Z87891 Personal history of nicotine dependence: Secondary | ICD-10-CM | POA: Diagnosis not present

## 2016-12-07 DIAGNOSIS — R932 Abnormal findings on diagnostic imaging of liver and biliary tract: Secondary | ICD-10-CM | POA: Diagnosis not present

## 2016-12-07 DIAGNOSIS — K5730 Diverticulosis of large intestine without perforation or abscess without bleeding: Secondary | ICD-10-CM | POA: Diagnosis not present

## 2016-12-07 DIAGNOSIS — C3412 Malignant neoplasm of upper lobe, left bronchus or lung: Secondary | ICD-10-CM | POA: Diagnosis not present

## 2016-12-13 DIAGNOSIS — C3410 Malignant neoplasm of upper lobe, unspecified bronchus or lung: Secondary | ICD-10-CM | POA: Diagnosis not present

## 2016-12-13 DIAGNOSIS — C3412 Malignant neoplasm of upper lobe, left bronchus or lung: Secondary | ICD-10-CM | POA: Diagnosis not present

## 2016-12-13 DIAGNOSIS — R918 Other nonspecific abnormal finding of lung field: Secondary | ICD-10-CM | POA: Diagnosis not present

## 2016-12-13 DIAGNOSIS — Z923 Personal history of irradiation: Secondary | ICD-10-CM | POA: Diagnosis not present

## 2016-12-13 DIAGNOSIS — I517 Cardiomegaly: Secondary | ICD-10-CM | POA: Diagnosis not present

## 2016-12-13 DIAGNOSIS — Z79899 Other long term (current) drug therapy: Secondary | ICD-10-CM | POA: Diagnosis not present

## 2016-12-13 DIAGNOSIS — Z87891 Personal history of nicotine dependence: Secondary | ICD-10-CM | POA: Diagnosis not present

## 2016-12-13 DIAGNOSIS — J90 Pleural effusion, not elsewhere classified: Secondary | ICD-10-CM | POA: Diagnosis not present

## 2016-12-27 DIAGNOSIS — Z5112 Encounter for antineoplastic immunotherapy: Secondary | ICD-10-CM | POA: Diagnosis not present

## 2016-12-27 DIAGNOSIS — J90 Pleural effusion, not elsewhere classified: Secondary | ICD-10-CM | POA: Diagnosis not present

## 2016-12-27 DIAGNOSIS — C3412 Malignant neoplasm of upper lobe, left bronchus or lung: Secondary | ICD-10-CM | POA: Diagnosis not present

## 2017-01-10 DIAGNOSIS — C3412 Malignant neoplasm of upper lobe, left bronchus or lung: Secondary | ICD-10-CM | POA: Diagnosis not present

## 2017-01-10 DIAGNOSIS — J90 Pleural effusion, not elsewhere classified: Secondary | ICD-10-CM | POA: Diagnosis not present

## 2017-01-10 DIAGNOSIS — Z5112 Encounter for antineoplastic immunotherapy: Secondary | ICD-10-CM | POA: Diagnosis not present

## 2017-01-25 DIAGNOSIS — J90 Pleural effusion, not elsewhere classified: Secondary | ICD-10-CM | POA: Diagnosis not present

## 2017-01-25 DIAGNOSIS — Z5111 Encounter for antineoplastic chemotherapy: Secondary | ICD-10-CM | POA: Diagnosis not present

## 2017-01-25 DIAGNOSIS — C3412 Malignant neoplasm of upper lobe, left bronchus or lung: Secondary | ICD-10-CM | POA: Diagnosis not present

## 2017-01-25 DIAGNOSIS — Z79899 Other long term (current) drug therapy: Secondary | ICD-10-CM | POA: Diagnosis not present

## 2017-02-07 DIAGNOSIS — Z79899 Other long term (current) drug therapy: Secondary | ICD-10-CM | POA: Diagnosis not present

## 2017-02-07 DIAGNOSIS — Z5111 Encounter for antineoplastic chemotherapy: Secondary | ICD-10-CM | POA: Diagnosis not present

## 2017-02-07 DIAGNOSIS — C3412 Malignant neoplasm of upper lobe, left bronchus or lung: Secondary | ICD-10-CM | POA: Diagnosis not present

## 2017-02-07 DIAGNOSIS — J90 Pleural effusion, not elsewhere classified: Secondary | ICD-10-CM | POA: Diagnosis not present

## 2017-02-15 DIAGNOSIS — C3412 Malignant neoplasm of upper lobe, left bronchus or lung: Secondary | ICD-10-CM | POA: Diagnosis not present

## 2017-02-15 DIAGNOSIS — J90 Pleural effusion, not elsewhere classified: Secondary | ICD-10-CM | POA: Diagnosis not present

## 2017-02-15 DIAGNOSIS — J9809 Other diseases of bronchus, not elsewhere classified: Secondary | ICD-10-CM | POA: Diagnosis not present

## 2017-02-15 DIAGNOSIS — J984 Other disorders of lung: Secondary | ICD-10-CM | POA: Diagnosis not present

## 2017-02-15 DIAGNOSIS — R222 Localized swelling, mass and lump, trunk: Secondary | ICD-10-CM | POA: Diagnosis not present

## 2017-02-22 DIAGNOSIS — C3412 Malignant neoplasm of upper lobe, left bronchus or lung: Secondary | ICD-10-CM | POA: Diagnosis not present

## 2017-03-09 DIAGNOSIS — C3412 Malignant neoplasm of upper lobe, left bronchus or lung: Secondary | ICD-10-CM | POA: Diagnosis not present

## 2017-03-09 DIAGNOSIS — R918 Other nonspecific abnormal finding of lung field: Secondary | ICD-10-CM | POA: Diagnosis not present

## 2017-03-09 DIAGNOSIS — J910 Malignant pleural effusion: Secondary | ICD-10-CM | POA: Diagnosis not present

## 2017-05-03 ENCOUNTER — Ambulatory Visit (INDEPENDENT_AMBULATORY_CARE_PROVIDER_SITE_OTHER): Payer: Medicare HMO | Admitting: Internal Medicine

## 2017-05-03 ENCOUNTER — Encounter (INDEPENDENT_AMBULATORY_CARE_PROVIDER_SITE_OTHER): Payer: Self-pay | Admitting: Internal Medicine

## 2017-08-11 DEATH — deceased

## 2019-03-19 IMAGING — MR MR HEAD WO/W CM
10 of 14 series · 34 of 48 positions shown · IV contrast (multihance)
Comparison: PET-CT 03/30/2016 and earlier.

CLINICAL DATA: 89-year-old male with recent diagnosis of
hypermetabolic left upper lobe lung mass most compatible with lung
cancer. Staging. Subsequent encounter.

EXAM:
MRI HEAD WITHOUT AND WITH CONTRAST
TECHNIQUE: Multiplanar, multiecho pulse sequences of the brain and surrounding
structures were obtained without and with intravenous contrast.
CONTRAST:  15mL MULTIHANCE GADOBENATE DIMEGLUMINE 529 MG/ML IV SOLN

[Series 4: DWI · axial · 3.0mm · 0.82mm/px · z∈[-89,+57]mm · 5 of 50 slices shown (1 of 4)]
[im 1/50]
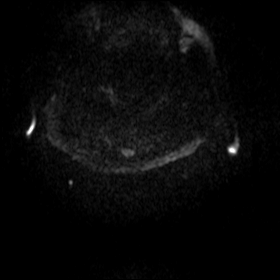
[im 13/50]
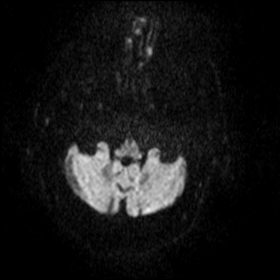
[im 25/50]
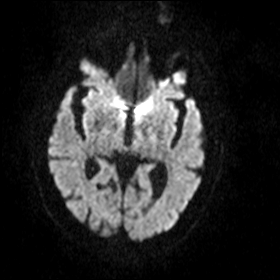
[im 37/50]
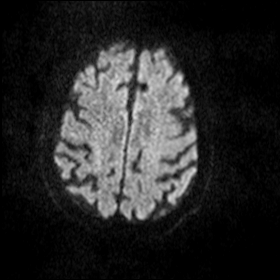
[im 50/50]
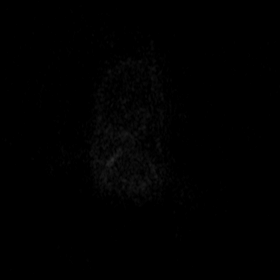

[Series 5: DWI · axial · 3.0mm · 0.82mm/px · z∈[-89,+57]mm · 4 of 50 slices shown (2 of 4)]
[im 1/50]
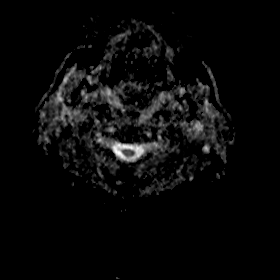
[im 17/50]
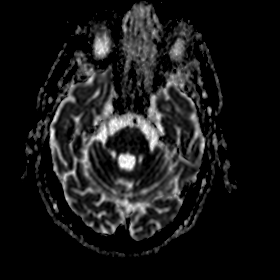
[im 33/50]
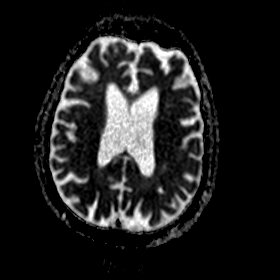
[im 50/50]
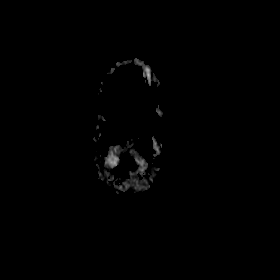

[Series 6: DWI · coronal · 5.0mm · 0.49mm/px · 3 of 34 slices shown (3 of 4)]
[im 1/34]
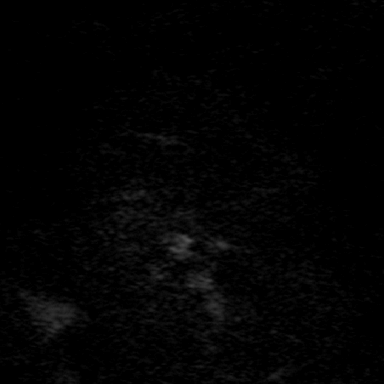
[im 17/34]
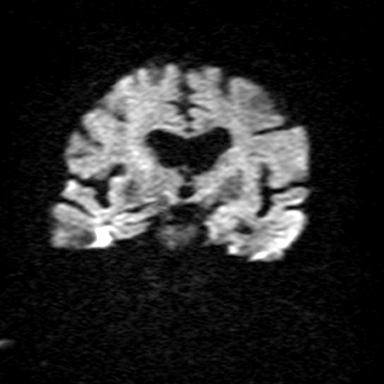
[im 34/34]
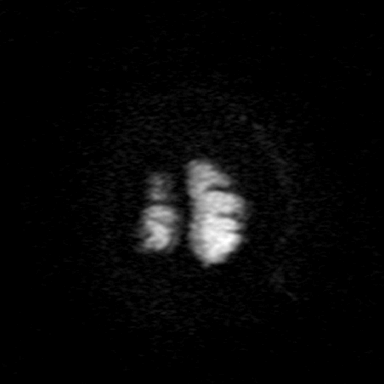

[Series 7: DWI · coronal · 5.0mm · 0.49mm/px · 3 of 34 slices shown (4 of 4)]
[im 1/34]
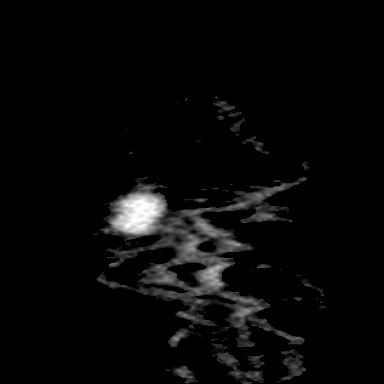
[im 17/34]
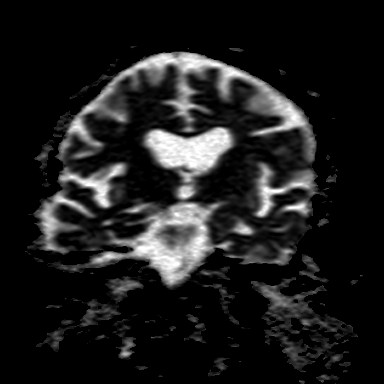
[im 34/34]
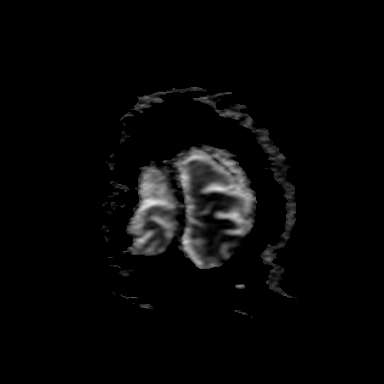

[Series 8: T2 · axial · 5.0mm · 0.75mm/px · z∈[-87,+56]mm · 2 of 23 slices shown (1 of 2)]
[im 1/23]
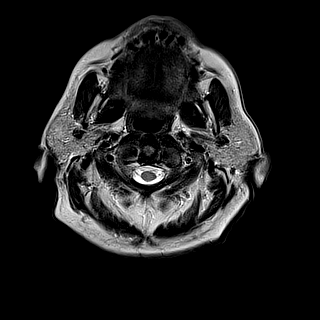
[im 23/23]
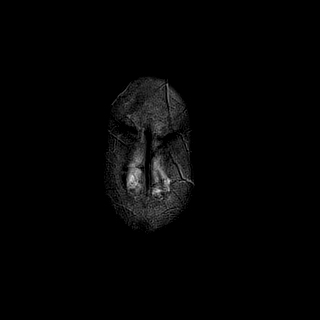

[Series 9: FLAIR · axial · 5.0mm · 0.94mm/px · z∈[-87,+56]mm · 2 of 23 slices shown]
[im 1/23]
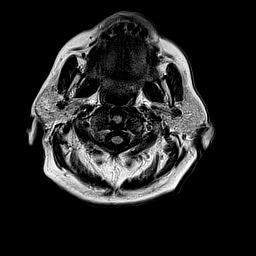
[im 23/23]
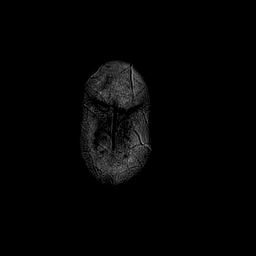

[Series 10: T1 · axial · 2.0mm · 0.47mm/px · z∈[-102,-50]mm · 3 of 95 slices shown]
[im 1/95]
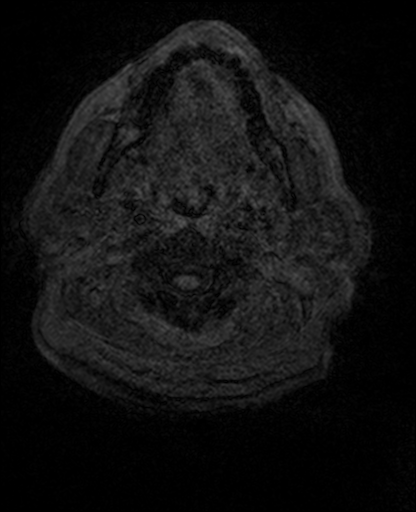
[im 14/95]
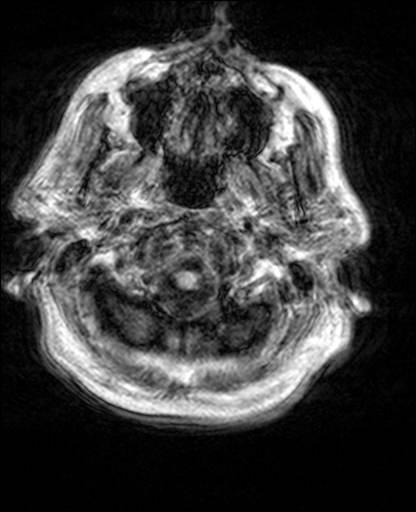
[im 27/95]
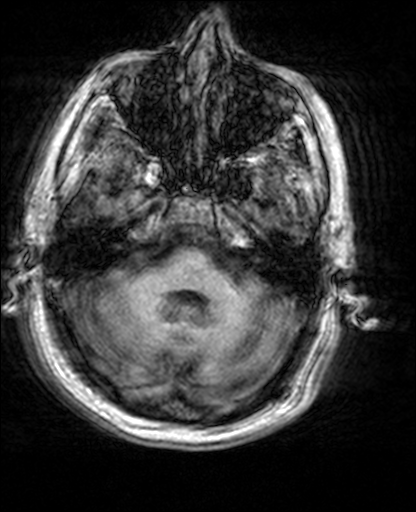

[Series 12: T2 · coronal · 5.0mm · 0.60mm/px · 2 of 28 slices shown (2 of 2)]
[im 1/28]
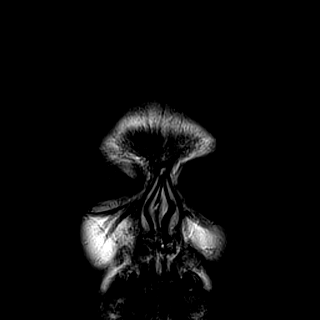
[im 28/28]
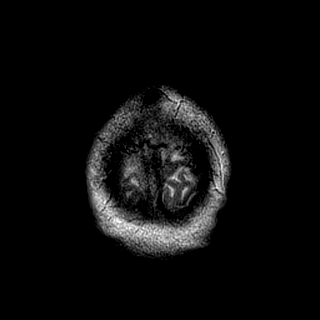

[Series 13: T1 post-contrast · axial · 2.0mm · 0.47mm/px · z∈[-102,+85]mm · 8 of 95 slices shown (1 of 2)]
[im 1/95]
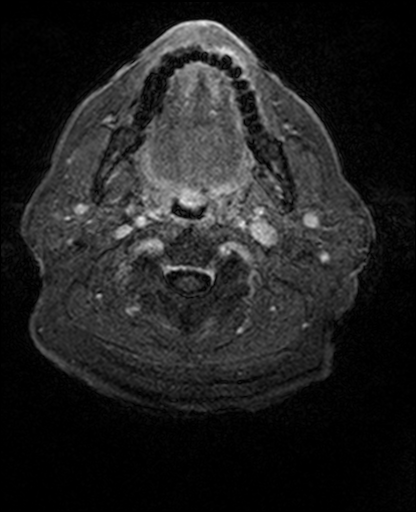
[im 14/95]
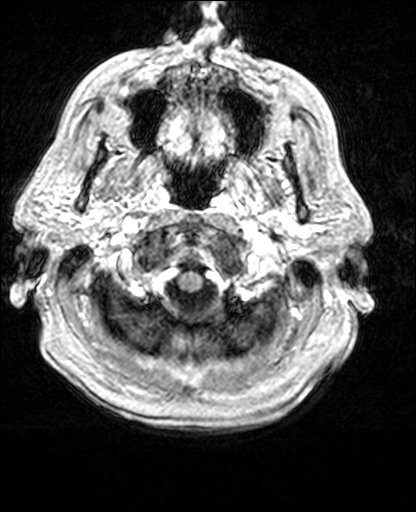
[im 27/95]
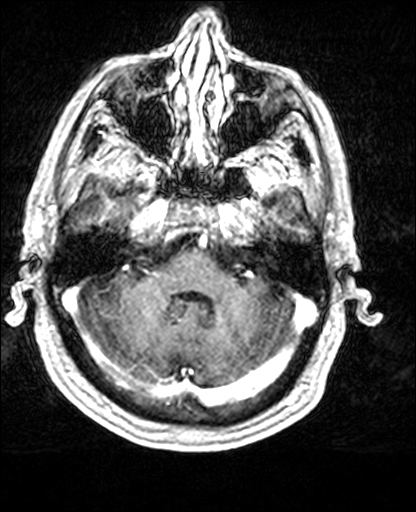
[im 41/95]
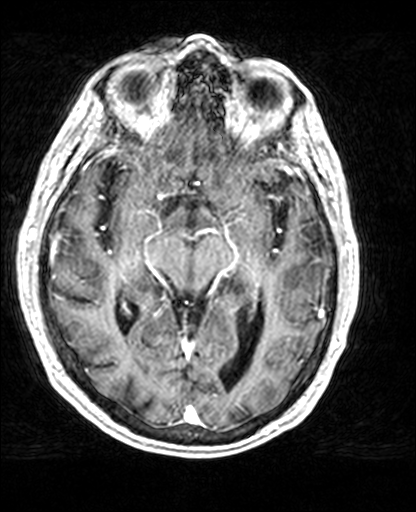
[im 54/95]
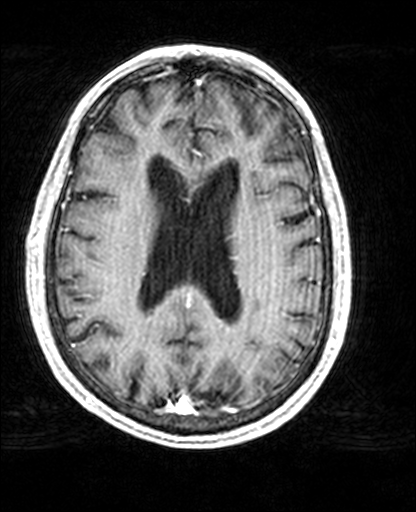
[im 68/95]
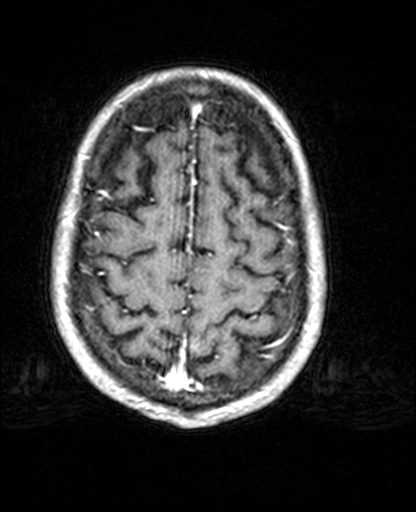
[im 81/95]
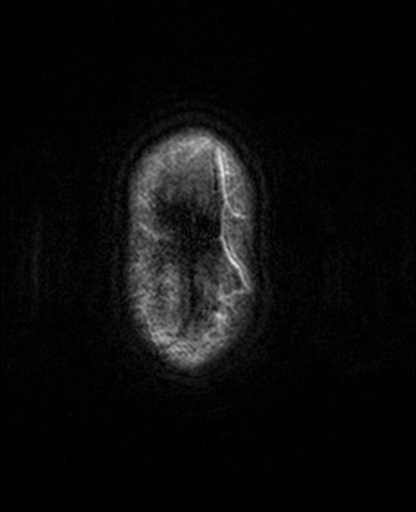
[im 95/95]
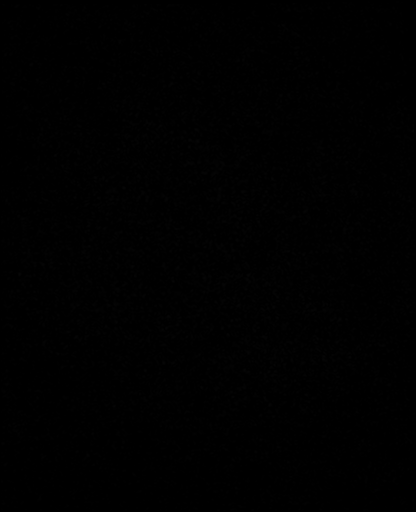

[Series 14: T1 post-contrast · coronal · 5.0mm · 0.42mm/px · 2 of 24 slices shown (2 of 2)]
[im 1/24]
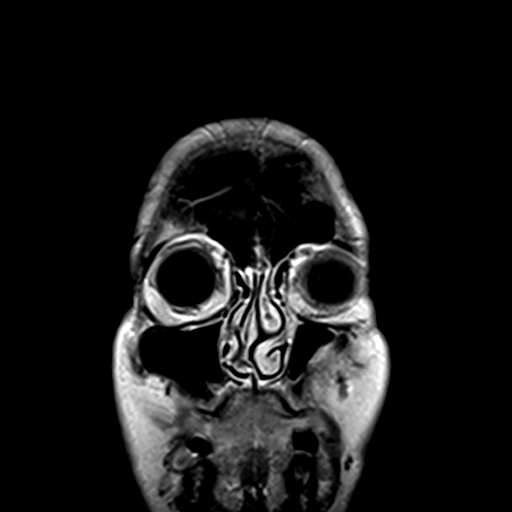
[im 24/24]
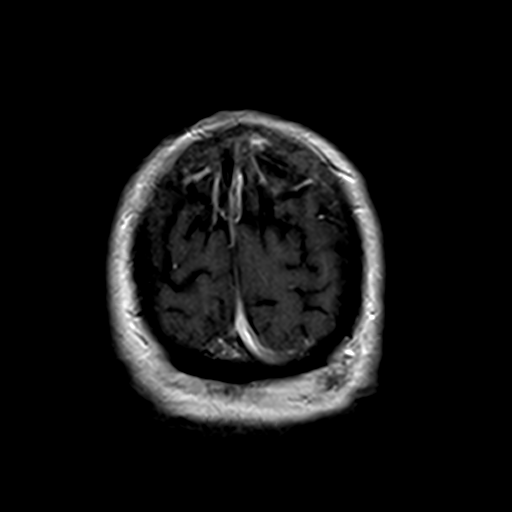

[34 of 48 positions shown; findings below may reference images not displayed]

FINDINGS: Brain: No midline shift, mass effect, or evidence of intracranial
mass lesion. No abnormal enhancement identified. No dural
thickening.

Cerebral volume is within normal limits for age. No restricted
diffusion to suggest acute infarction. No ventriculomegaly,
extra-axial collection or acute intracranial hemorrhage.
Cervicomedullary junction and pituitary are within normal limits.

Gray and white matter signal is within normal limits for age
throughout the brain. No cortical encephalomalacia or chronic
cerebral blood products.

Vascular: Major intracranial vascular flow voids are preserved.

Skull and upper cervical spine: Negative. Visualized bone marrow
signal is within normal limits.

Sinuses/Orbits: Postoperative changes to the globes, otherwise
normal orbits soft tissues. Visualized paranasal sinuses and
mastoids are well pneumatized.

Other: Visible internal auditory structures appear normal. Negative
scalp soft tissues.
IMPRESSION: No metastatic disease or acute intracranial abnormality. Normal for
age MRI appearance of the brain.

## 2019-04-07 IMAGING — CR DG CHEST 2V
2 series · 2 of 2 positions shown · non-contrast
Comparison: 04/05/2016

CLINICAL DATA: Preop for Port-A-Cath insertion. History of squamous
cell carcinoma.

EXAM:
CHEST  2 VIEW

[w chest pa]
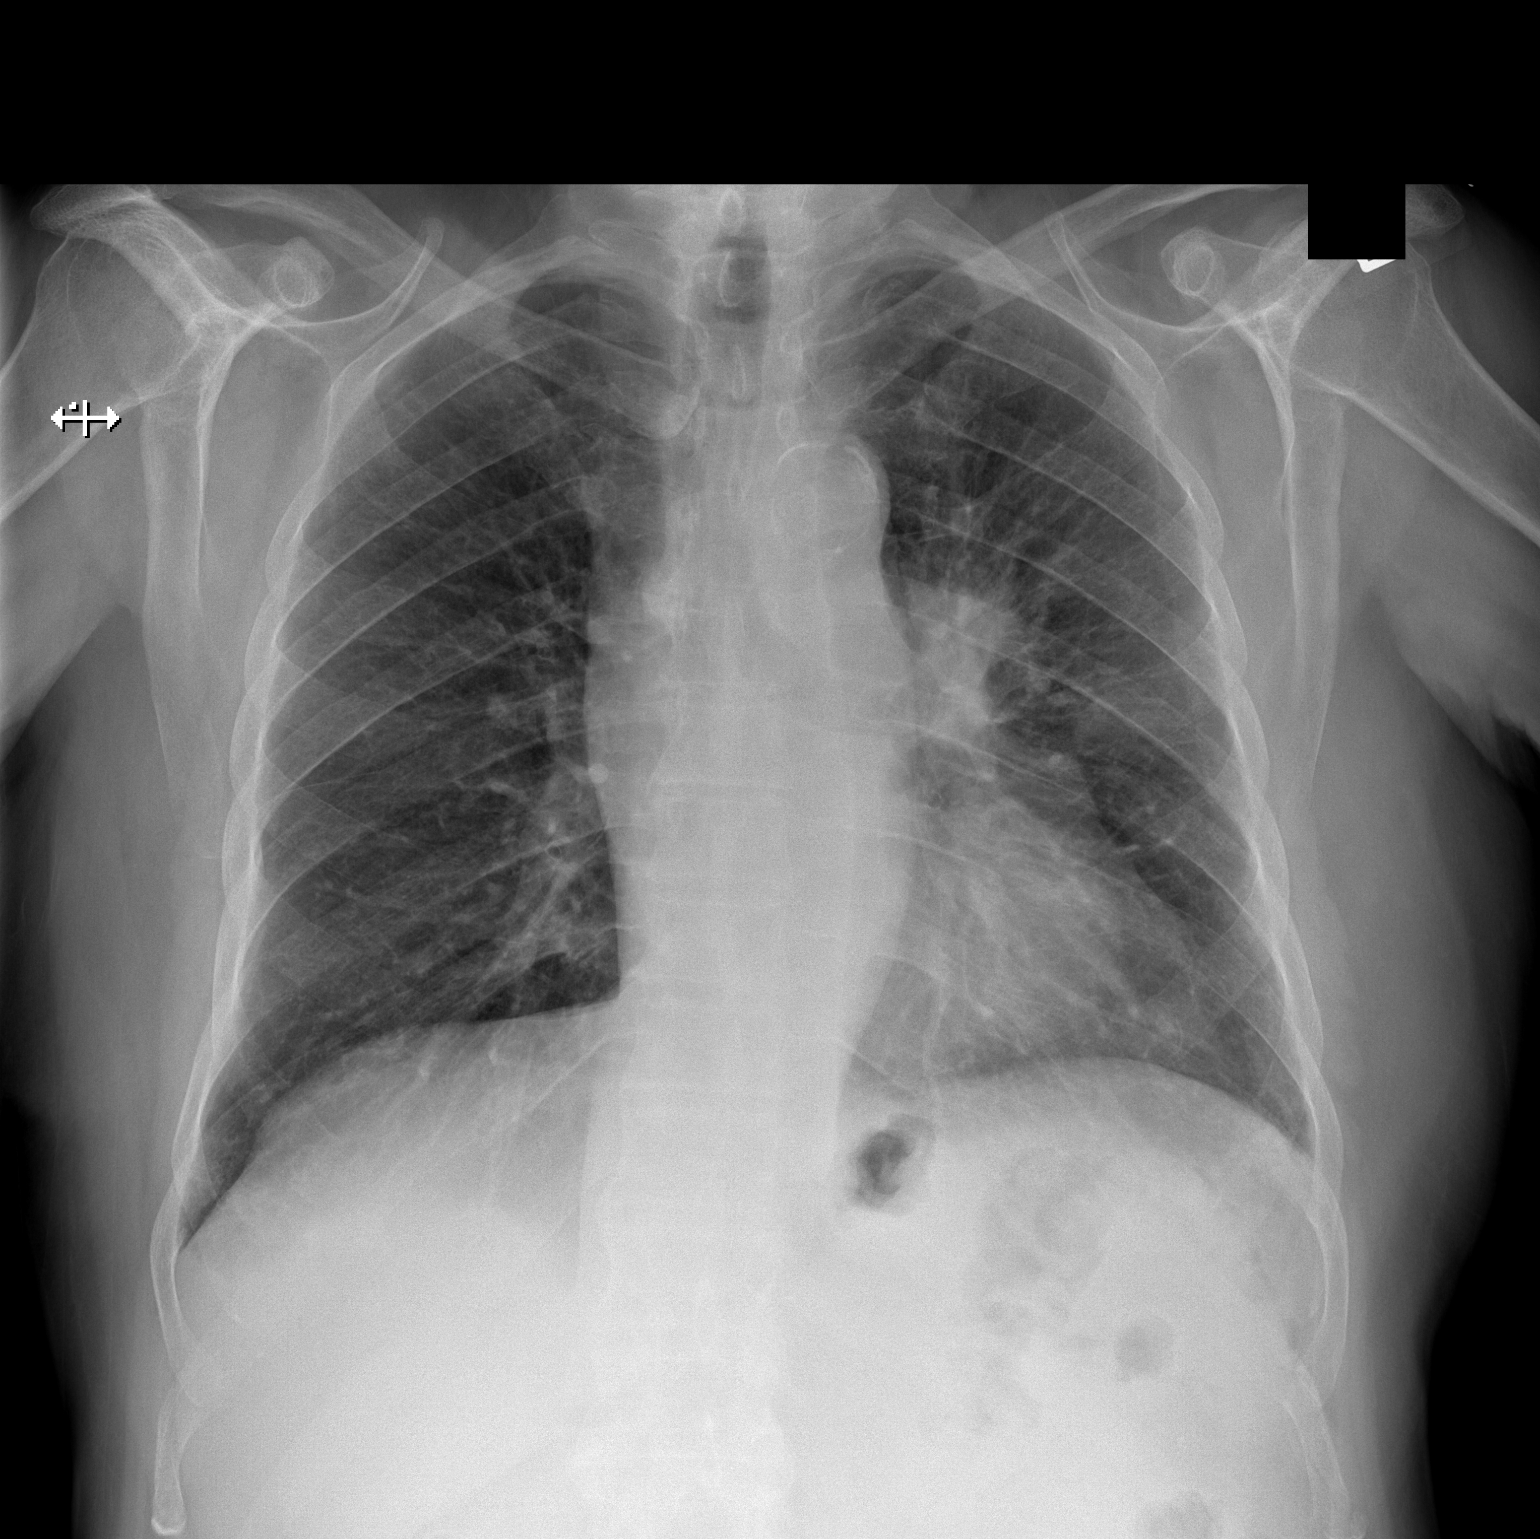

[w chest lat]
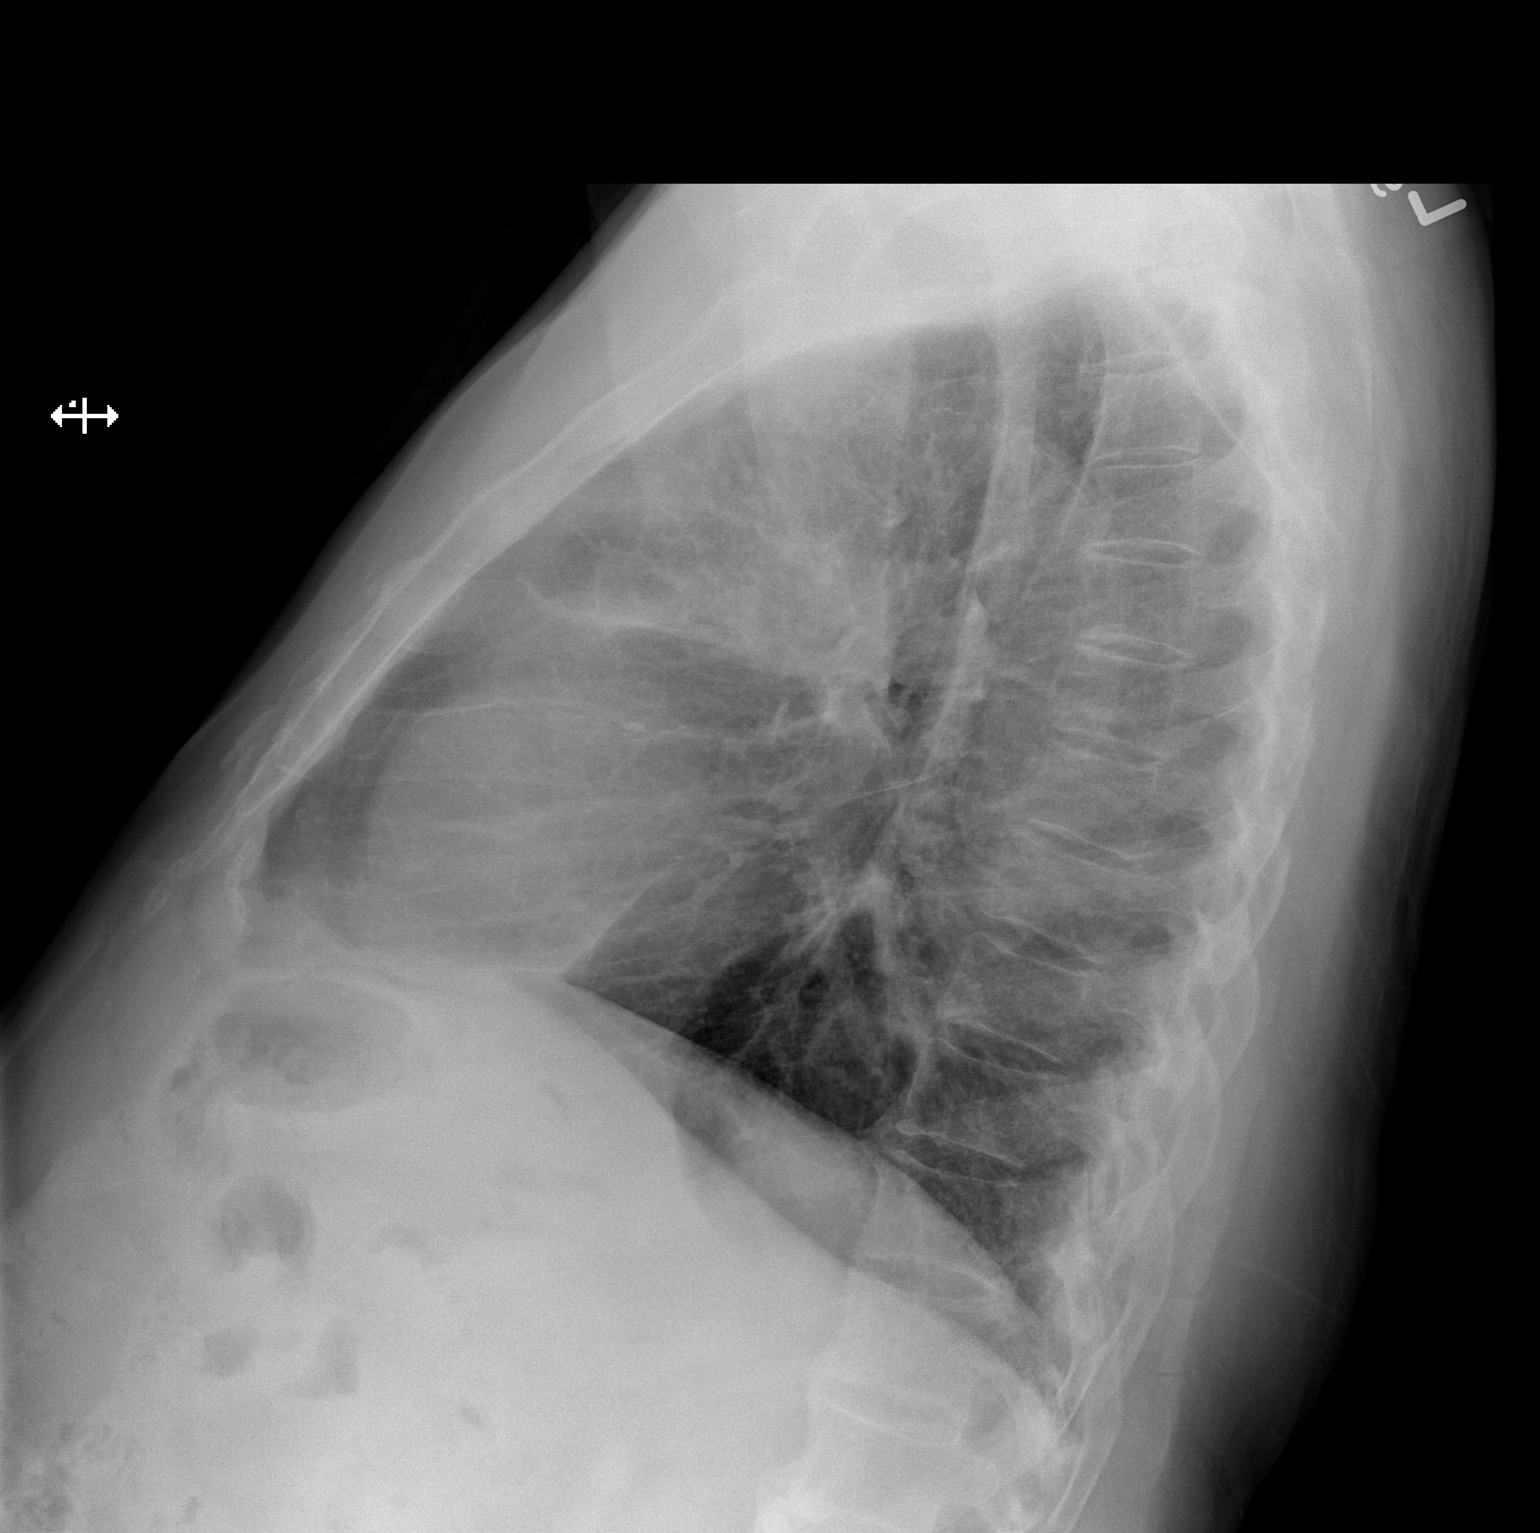

[2 of 2 positions shown; findings below may reference images not displayed]

FINDINGS: Again noted is fullness in left hilum compatible with the patient's
known mass. Prominent interstitial densities in the left lung
compared to the right have minimally changed. Heart and mediastinum
are within normal limits and stable. Atherosclerotic calcifications
at the aortic arch. Trachea is midline. Negative for pneumothorax.
No pleural effusions.
IMPRESSION: No acute chest findings.

Soft tissue fullness in the left hilum compatible with the central
lung mass.

## 2019-04-07 IMAGING — CR DG CHEST 2V
2 series · 2 of 2 positions shown · non-contrast
Comparison: Earlier study of 04/20/2016

CLINICAL DATA: Post Port-A-Cath insertion, lung cancer, former
smoker

EXAM:
CHEST  2 VIEW

[w chest pa]
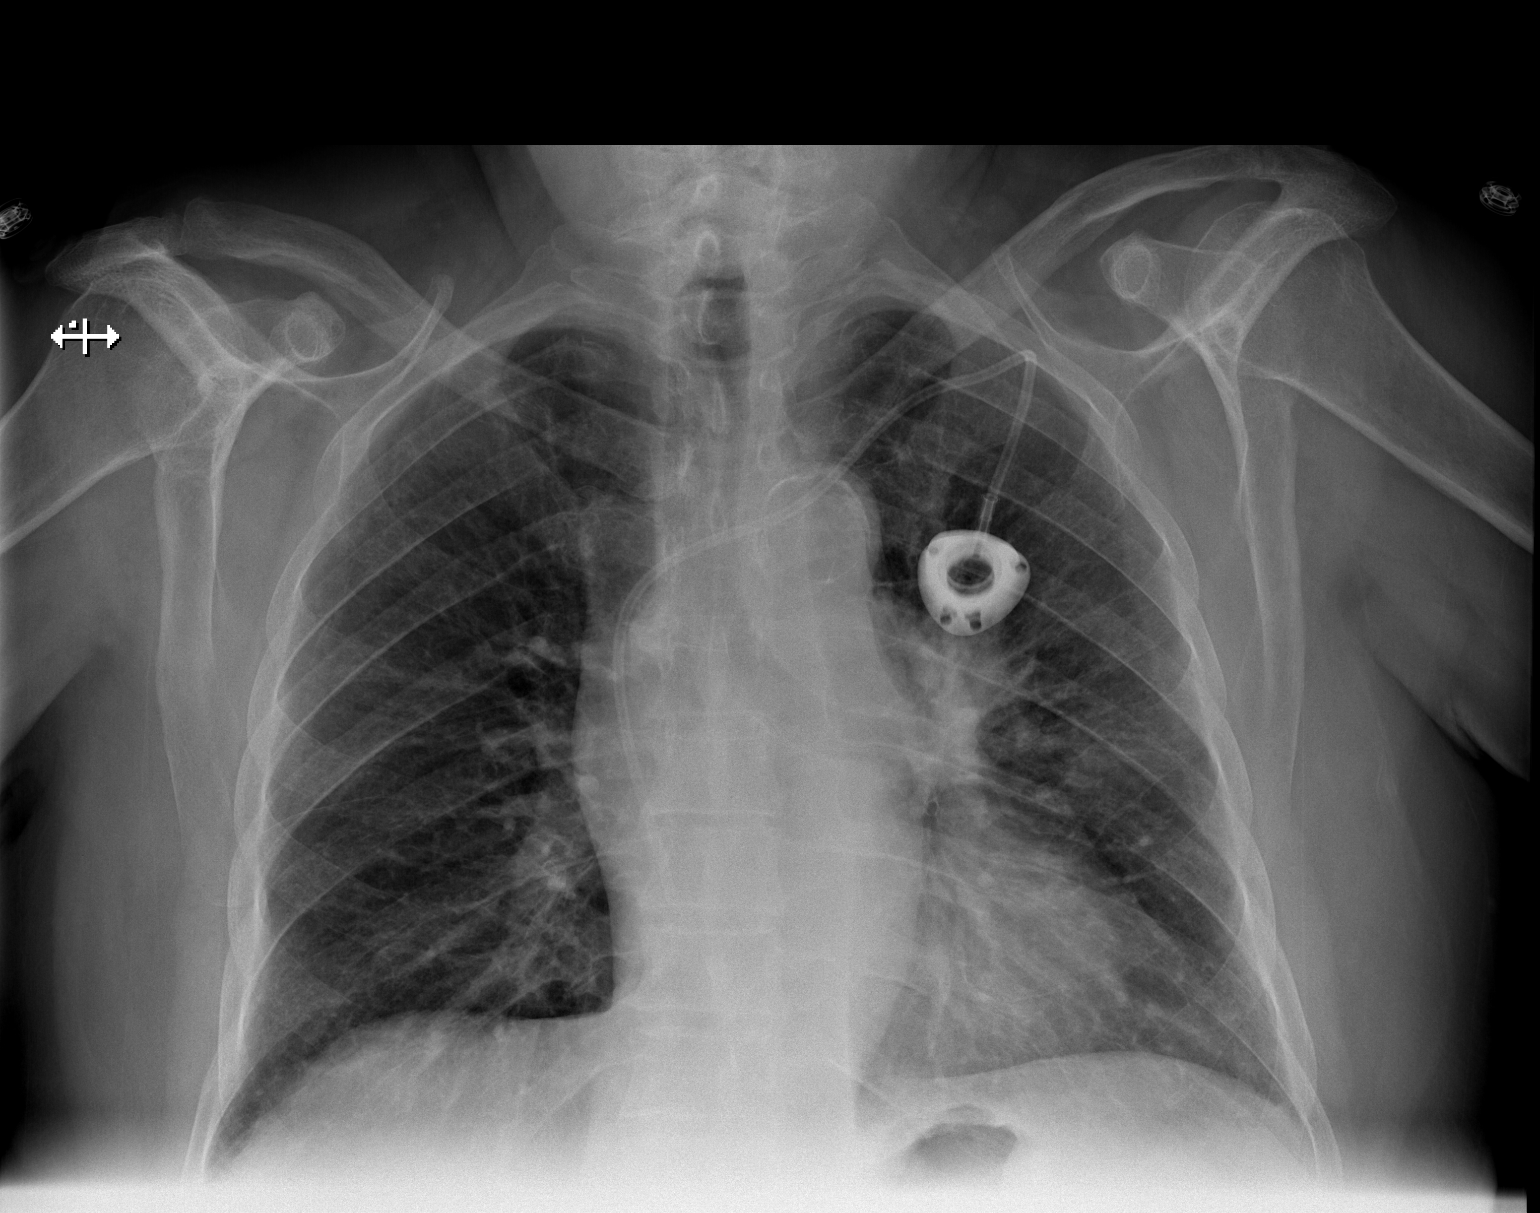

[w chest lat]
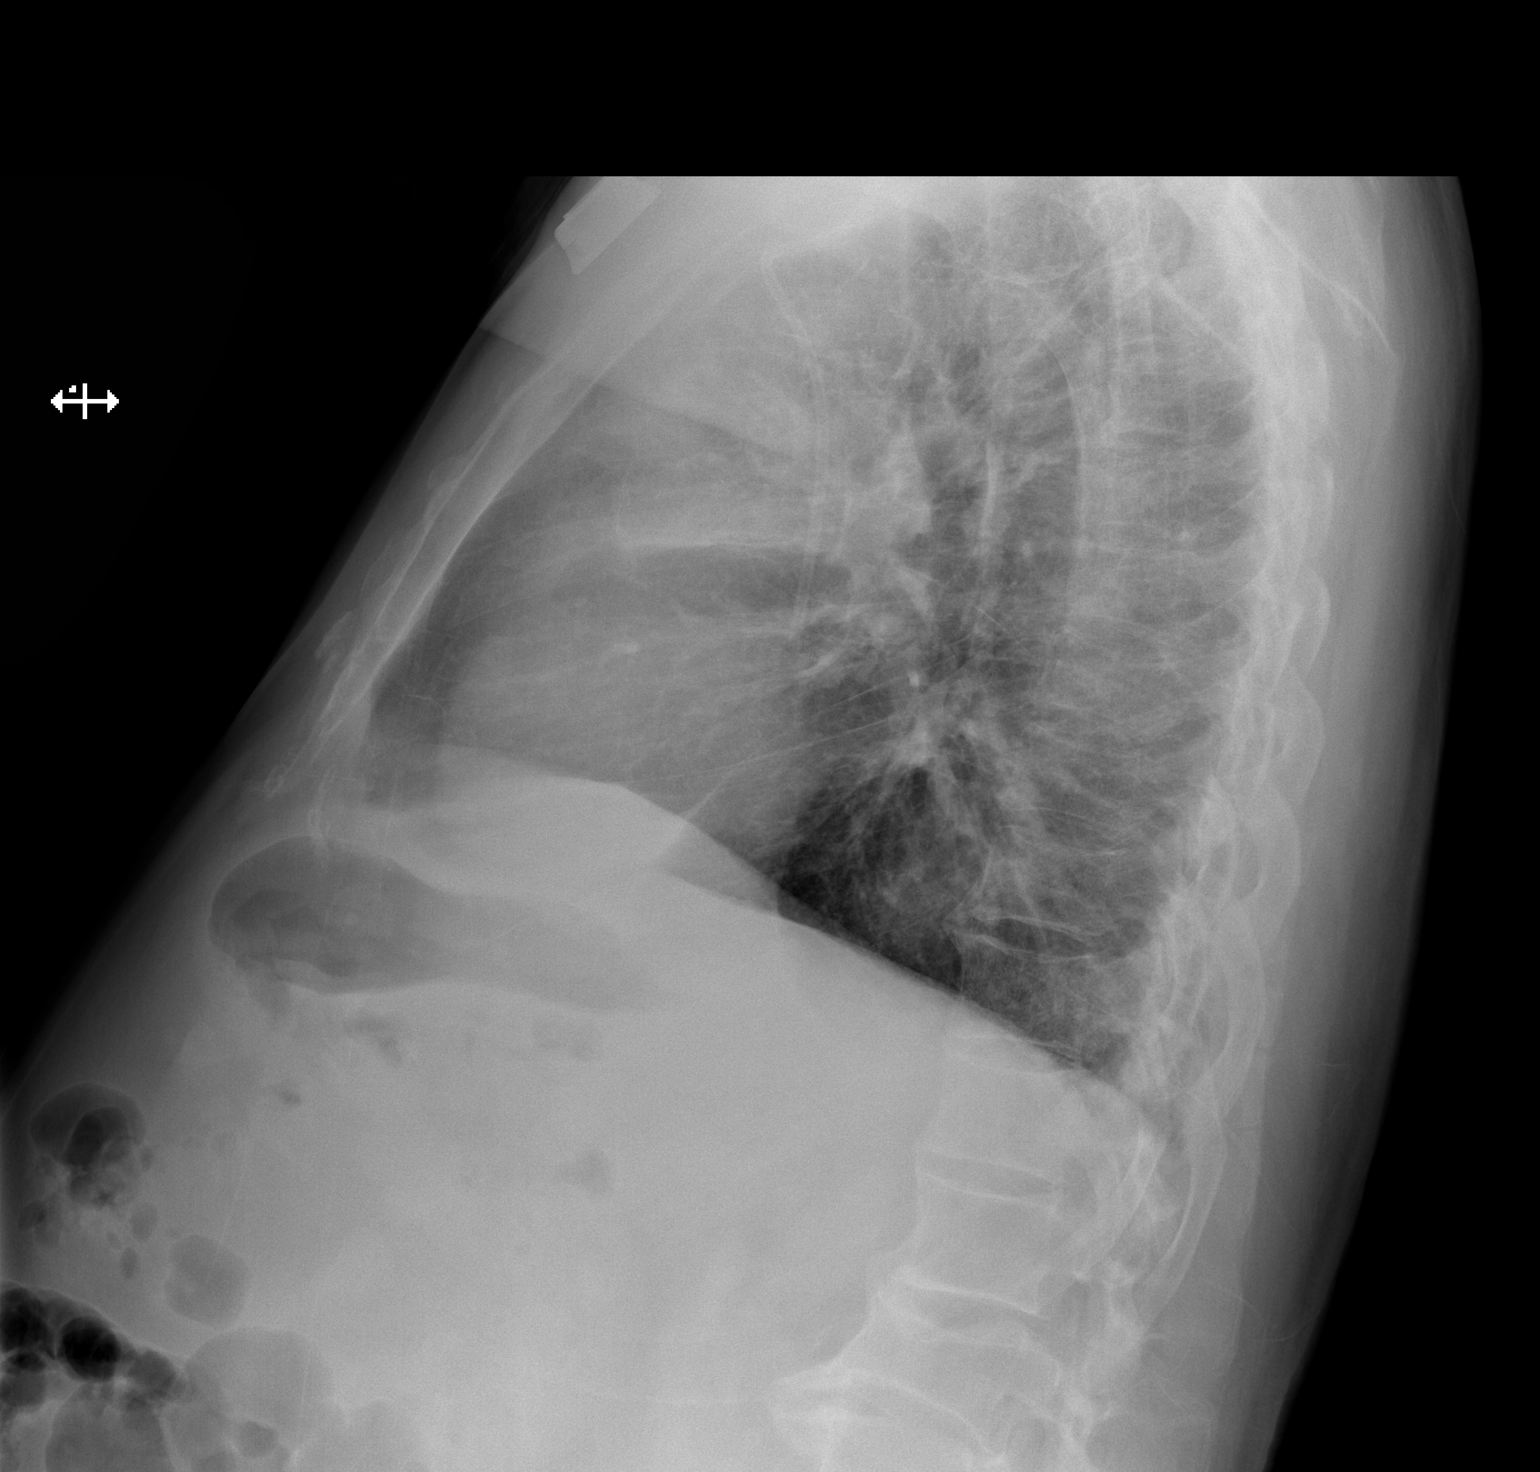

[2 of 2 positions shown; findings below may reference images not displayed]

FINDINGS: New LEFT subclavian Port-A-Cath with tip projecting over high RIGHT
atrium.

Stable heart size and pulmonary vascularity.

Atherosclerotic calcification and elongation of thoracic aorta.

Bronchitic and emphysematous changes again seen.

LEFT perihilar mass again identified.

No infiltrate, pleural effusion or pneumothorax.
IMPRESSION: No pneumothorax following LEFT subclavian Port-A-Cath insertion.

Otherwise stable exam.
